# Patient Record
Sex: Female | Born: 1970 | Race: White | Hispanic: No | Marital: Married | Smoking: Never smoker
Health system: Southern US, Community
[De-identification: ages and names within clinical notes are randomized; demographics above are authoritative.]

## PROBLEM LIST (undated history)

## (undated) ENCOUNTER — Emergency Department (HOSPITAL_COMMUNITY): Payer: BC Managed Care – PPO | Source: Home / Self Care

## (undated) DIAGNOSIS — D126 Benign neoplasm of colon, unspecified: Secondary | ICD-10-CM

## (undated) DIAGNOSIS — I10 Essential (primary) hypertension: Secondary | ICD-10-CM

## (undated) DIAGNOSIS — K219 Gastro-esophageal reflux disease without esophagitis: Secondary | ICD-10-CM

## (undated) DIAGNOSIS — F329 Major depressive disorder, single episode, unspecified: Secondary | ICD-10-CM

## (undated) DIAGNOSIS — K5792 Diverticulitis of intestine, part unspecified, without perforation or abscess without bleeding: Secondary | ICD-10-CM

## (undated) DIAGNOSIS — K2970 Gastritis, unspecified, without bleeding: Secondary | ICD-10-CM

## (undated) DIAGNOSIS — K760 Fatty (change of) liver, not elsewhere classified: Secondary | ICD-10-CM

## (undated) DIAGNOSIS — M549 Dorsalgia, unspecified: Secondary | ICD-10-CM

## (undated) DIAGNOSIS — F32A Depression, unspecified: Secondary | ICD-10-CM

## (undated) DIAGNOSIS — G4730 Sleep apnea, unspecified: Secondary | ICD-10-CM

## (undated) DIAGNOSIS — K648 Other hemorrhoids: Secondary | ICD-10-CM

## (undated) DIAGNOSIS — E559 Vitamin D deficiency, unspecified: Secondary | ICD-10-CM

## (undated) DIAGNOSIS — R1310 Dysphagia, unspecified: Secondary | ICD-10-CM

## (undated) HISTORY — DX: Diverticulitis of intestine, part unspecified, without perforation or abscess without bleeding: K57.92

## (undated) HISTORY — PX: CHOLECYSTECTOMY: SHX55

## (undated) HISTORY — PX: OTHER SURGICAL HISTORY: SHX169

## (undated) HISTORY — DX: Benign neoplasm of colon, unspecified: D12.6

## (undated) HISTORY — PX: CERVICAL BIOPSY  W/ LOOP ELECTRODE EXCISION: SUR135

## (undated) HISTORY — DX: Essential (primary) hypertension: I10

## (undated) HISTORY — DX: Major depressive disorder, single episode, unspecified: F32.9

## (undated) HISTORY — DX: Other hemorrhoids: K64.8

## (undated) HISTORY — DX: Sleep apnea, unspecified: G47.30

## (undated) HISTORY — DX: Gastritis, unspecified, without bleeding: K29.70

## (undated) HISTORY — DX: Vitamin D deficiency, unspecified: E55.9

## (undated) HISTORY — PX: POLYPECTOMY: SHX149

## (undated) HISTORY — DX: Gastro-esophageal reflux disease without esophagitis: K21.9

## (undated) HISTORY — DX: Depression, unspecified: F32.A

## (undated) HISTORY — DX: Fatty (change of) liver, not elsewhere classified: K76.0

## (undated) HISTORY — PX: COLONOSCOPY: SHX174

## (undated) HISTORY — DX: Dorsalgia, unspecified: M54.9

## (undated) HISTORY — PX: MICROLARYNGOSCOPY WITH CO2 LASER AND EXCISION OF VOCAL CORD LESION: SHX5970

---
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
## 2007-06-22 ENCOUNTER — Ambulatory Visit: Payer: Self-pay | Admitting: Obstetrics and Gynecology

## 2010-01-18 DIAGNOSIS — G4730 Sleep apnea, unspecified: Secondary | ICD-10-CM

## 2010-01-18 HISTORY — DX: Sleep apnea, unspecified: G47.30

## 2012-04-07 ENCOUNTER — Encounter: Payer: Self-pay | Admitting: Gastroenterology

## 2012-04-10 ENCOUNTER — Encounter: Payer: Self-pay | Admitting: Internal Medicine

## 2012-04-10 ENCOUNTER — Telehealth: Payer: Self-pay | Admitting: *Deleted

## 2012-04-10 NOTE — Telephone Encounter (Signed)
Pt called for an earlier appt and I scheduled her for am to see Dr Rhea Belton. Pt states she had a CT done at [facility] last week showing diverticulosis, but she is having upper abdominal pain in the center of her abdomen between her breasts. She has tried generic Zantac with no help. Pt given an appt in am ; called for a copy of scan; pt does have the disc.   [phone]

## 2012-04-11 ENCOUNTER — Encounter: Payer: Self-pay | Admitting: Internal Medicine

## 2012-04-11 ENCOUNTER — Ambulatory Visit (INDEPENDENT_AMBULATORY_CARE_PROVIDER_SITE_OTHER): Payer: Self-pay | Admitting: Internal Medicine

## 2012-04-11 ENCOUNTER — Telehealth: Payer: Self-pay | Admitting: Internal Medicine

## 2012-04-11 VITALS — BP 134/80 | HR 93 | Ht 64.0 in | Wt 207.8 lb

## 2012-04-11 DIAGNOSIS — R1310 Dysphagia, unspecified: Secondary | ICD-10-CM

## 2012-04-11 DIAGNOSIS — I10 Essential (primary) hypertension: Secondary | ICD-10-CM | POA: Insufficient documentation

## 2012-04-11 DIAGNOSIS — R1013 Epigastric pain: Secondary | ICD-10-CM

## 2012-04-11 MED ORDER — PANTOPRAZOLE SODIUM 40 MG PO TBEC: 40.0000 mg | DELAYED_RELEASE_TABLET | Freq: Every day | ORAL | Status: DC

## 2012-04-11 NOTE — Progress Notes (Signed)
Subjective:    Patient ID: Carly [name], female    DOB: 1970-04-05, 42 y.o.   MRN: [id number]  HPI Mrs. Milligan is a 42 yo female with PMH of HTN who seen for evaluation of epigastric abdominal pain and dysphagia. The patient reports epigastric abdominal pain which she has been experiencing since late January 2014. This pain seems to come and go it does not radiate. At times it can be sharp in nature and also at times associated with nausea and vomiting. She recently had an episode over the weekend for the nausea was more severe and she had an episode of emesis which was clear without blood or bile. She denies heartburn. She has noted some solid food dysphagia without odynophagia. No melena or rectal bleeding. Bowel movements have been normal without diarrhea or constipation. She does occasionally use NSAIDs, but not on a daily basis. She denies early satiety. Overall appetite has been good. At times she feels like food worsens her symptoms, but then at other times she is unsure. No fevers or chills. No mouth ulcers. She's not having significant heartburn   Review of Systems As per history of present illness, otherwise negative  Past Medical History  Diagnosis Date  . Hypertension    History reviewed. No pertinent past surgical history.  has a current medication list which includes the following prescription(s): lisinopril-hydrochlorothiazide, nebivolol, norethin-eth estrad triphasic, simethicone, and pantoprazole.   Family History  Problem Relation Age of Onset  . Diabetes Father   . Diabetes Maternal Grandmother   . Diabetes Paternal Grandmother   . Lung cancer Father   . Bladder Cancer Father   . Heart disease Father    History   Social History  . Marital Status: Unknown    Spouse Name: N/A    Number of Children: 2  . Years of Education: N/A   Social History Main Topics  . Smoking status: Never Smoker   . Smokeless tobacco: Never Used  . Alcohol Use: No  . Drug Use: No   . Sexually Active: None   Other Topics Concern  . None   Social History Narrative  . None      Objective:   Physical Exam BP 134/80  Pulse 93  Ht 5\' 4"  (1.626 m)  Wt 207 lb 12.8 oz (94.257 kg)  BMI 35.65 kg/m2  SpO2 98% Constitutional: Well-developed and well-nourished. No distress. HEENT: Normocephalic and atraumatic. Oropharynx is clear and moist. No oropharyngeal exudate. Conjunctivae are normal.  No scleral icterus. Neck: Neck supple. Trachea midline. Cardiovascular: Normal rate, regular rhythm and intact distal pulses. No M/R/G Pulmonary/chest: Effort normal and breath sounds normal. No wheezing, rales or rhonchi. Abdominal: Soft, mild epigastric abdominal tenderness without rebound or guarding, nondistended. Bowel sounds active throughout. There are no masses palpable. No hepatosplenomegaly. Extremities: no clubbing, cyanosis, or edema Lymphadenopathy: No cervical adenopathy noted. Neurological: Alert and oriented to person place and time. Skin: Skin is warm and dry. No rashes noted. Psychiatric: Normal mood and affect. Behavior is normal.  CT scan from [facility] dated 04/06/2012, without contrast Impression. No evidence of bowel obstruction. Normal appendix. Colonic diverticulosis without associated inflammatory changes. No CT findings to account for the patient's abdominal pain.     Assessment & Plan:  42 yo female with PMH of HTN who seen for evaluation of epigastric abdominal pain and dysphagia.   1.  Epigastric abdominal pain/esophageal dysphagia -- the patient's symptoms her dyspeptic in nature and her CT scan which was reviewed was unremarkable.  I recommended further evaluation with upper endoscopy. I will start her on a trial of pantoprazole 40 mg daily. She's advised to take this 30 minutes to one hour before breakfast.   She voices understanding. Further recommendations to be made after her upper endoscopy. Upper endoscopy will help rule out  gastritis/duodenitis, PUD, and H. pylori. If her upper endoscopy is unremarkable she may need an ultrasound to further evaluate her gallbladder.

## 2012-04-11 NOTE — Patient Instructions (Addendum)
   You have been scheduled for an endoscopy with propofol. Please follow written instructions given to you at your visit today. If you use inhalers (even only as needed), please bring them with you on the day of your procedure.  We have sent the following medications to your pharmacy for you to pick up at your convenience: protonix, please take as directed                                               We are excited to introduce MyChart, a new best-in-class service that provides you online access to important information in your electronic medical record. We want to make it easier for you to view your health information - all in one secure location - when and where you need it. We expect MyChart will enhance the quality of care and service we provide.  When you register for MyChart, you can:    View your test results.    Request appointments and receive appointment reminders via email.    Request medication renewals.    View your medical history, allergies, medications and immunizations.    Communicate with your physician's office through a password-protected site.    Conveniently print information such as your medication lists.  To find out if MyChart is right for you, please talk to a member of our clinical staff today. We will gladly answer your questions about this free health and wellness tool.  If you are age 41 or older and want a member of your family to have access to your record, you must provide written consent by completing a proxy form available at our office. Please speak to our clinical staff about guidelines regarding accounts for patients younger than age 64.  As you activate your MyChart account and need any technical assistance, please call the MyChart technical support line at [phone]) 83-CHART 601-648-2407) or email your question to [email or web address]. If you email your question(s), please include your name, a return phone number and the best time to reach  you.  If you have non-urgent health-related questions, you can send a message to our office through MyChart at [email or web address]. If you have a medical emergency, call 911.  Thank you for using MyChart as your new health and wellness resource!   MyChart licensed from [employer],  4540-9811. Patents Pending.

## 2012-04-11 NOTE — Telephone Encounter (Signed)
Pt said she has a medication card for prescriptions and [facility] no longer takes it. She asked if I could send in her Rx for protonix to [facility]. I told her I will and if that is still too expensive she could try over the counter Prilosec or omeprazole. Pt stated understanding.

## 2012-04-26 ENCOUNTER — Encounter: Payer: Self-pay | Admitting: Internal Medicine

## 2012-04-26 ENCOUNTER — Ambulatory Visit (AMBULATORY_SURGERY_CENTER): Payer: Self-pay | Admitting: Internal Medicine

## 2012-04-26 VITALS — BP 150/96 | HR 76 | Temp 98.9°F | Resp 21 | Ht 64.0 in | Wt 207.0 lb

## 2012-04-26 DIAGNOSIS — R1013 Epigastric pain: Secondary | ICD-10-CM

## 2012-04-26 DIAGNOSIS — R1310 Dysphagia, unspecified: Secondary | ICD-10-CM

## 2012-04-26 DIAGNOSIS — K209 Esophagitis, unspecified: Secondary | ICD-10-CM

## 2012-04-26 MED ORDER — SODIUM CHLORIDE 0.9 % IV SOLN: 500.0000 mL | INTRAVENOUS | Status: DC

## 2012-04-26 NOTE — Progress Notes (Signed)
A/ox3 pleased with MAC report to Jane RN 

## 2012-04-26 NOTE — Patient Instructions (Addendum)

## 2012-04-26 NOTE — Progress Notes (Signed)
Patient did not experience any of the following events: a burn prior to discharge; a fall within the facility; wrong site/side/patient/procedure/implant event; or a hospital transfer or hospital admission upon discharge from the facility. (G8907) Patient did not have preoperative order for IV antibiotic SSI prophylaxis. (G8918)  

## 2012-04-26 NOTE — Op Note (Signed)
[facility] [address] [place], [address]   ENDOSCOPY PROCEDURE REPORT  PATIENT: Carly, [name]  MR#: [id number] BIRTHDATE: Aug 31, 1970 , 41  yrs. old GENDER: Female ENDOSCOPIST: Beverley Fiedler, MD REFERRED BY: PROCEDURE DATE:  04/26/2012 PROCEDURE:  EGD w/ biopsy ASA CLASS:     Class II INDICATIONS:  Epigastric pain.   Dysphagia. MEDICATIONS: MAC sedation, administered by CRNA and Propofol (Diprivan) 260 mg IV TOPICAL ANESTHETIC: none  DESCRIPTION OF PROCEDURE: After the risks benefits and alternatives of the procedure were thoroughly explained, informed consent was obtained.  The LB GIF-H180 [id number] endoscope was introduced through the mouth and advanced to the second portion of the duodenum. Without limitations.  The instrument was slowly withdrawn as the mucosa was fully examined.   ESOPHAGUS: The mucosa of the esophagus appeared normal.  Multiple biopsies were taken in the distal and mid esophagus to rule out eosinophilic esophagitis.   A normal Z-line was observed 40 cm from the incisors.  STOMACH: Mild gastritis (inflammation) was found in the gastric body and gastric antrum.  Multiple biopsies were performed using cold forceps.  Sample sent for histology and for helicobacter pylori testing.  DUODENUM: The duodenal mucosa showed no abnormalities in the bulb and second portion of the duodenum.  Retroflexed views revealed no abnormalities.     The scope was then withdrawn from the patient and the procedure completed.  COMPLICATIONS: There were no complications. ENDOSCOPIC IMPRESSION: 1.   The mucosa of the esophagus appeared normal; multiple biopsies were taken in the distal and mid esophagus to rule out eosinophilic esophagitis given dysphagia 2.   Normal Z-line was observed 40 cm from the incisors 3.   Gastritis (inflammation) was found in the gastric body and gastric antrum; multiple biopsies 4.   The duodenal mucosa showed no abnormalities  in the bulb and second portion of the duodenum  RECOMMENDATIONS: 1.  Await pathology results 2.  Follow-up of helicobacter pylori status, treat if indicated 3.  Continue taking your PPI (pantoprazole) once daily.  It is best to be taken 20-30 minutes prior to breakfast meal.  eSigned:  Beverley Fiedler, MD 04/26/2012 10:25 Ermalene Searing CC:The Patient, Raenette Rover, MD

## 2012-04-27 ENCOUNTER — Telehealth: Payer: Self-pay | Admitting: *Deleted

## 2012-04-27 ENCOUNTER — Ambulatory Visit: Payer: Self-pay | Admitting: Gastroenterology

## 2012-04-27 NOTE — Telephone Encounter (Signed)
  Follow up Call-  Call back number 04/26/2012  Post procedure Call Back phone  # [phone] cell  Permission to leave phone message Yes     Patient questions:  Do you have a fever, pain , or abdominal swelling? no Pain Score  0 *  Have you tolerated food without any problems? yes  Have you been able to return to your normal activities? yes  Do you have any questions about your discharge instructions: Diet   no Medications  no Follow up visit  no  Do you have questions or concerns about your Care? no  Actions: * If pain score is 4 or above: No action needed, pain <4.

## 2012-05-03 ENCOUNTER — Ambulatory Visit: Payer: Self-pay

## 2012-05-03 ENCOUNTER — Encounter: Payer: Self-pay | Admitting: Internal Medicine

## 2012-05-08 ENCOUNTER — Ambulatory Visit: Payer: Self-pay

## 2012-06-09 ENCOUNTER — Telehealth: Payer: Self-pay | Admitting: Internal Medicine

## 2012-06-09 DIAGNOSIS — R112 Nausea with vomiting, unspecified: Secondary | ICD-10-CM

## 2012-06-09 DIAGNOSIS — R1010 Upper abdominal pain, unspecified: Secondary | ICD-10-CM

## 2012-06-19 NOTE — Telephone Encounter (Signed)
Faxed order for U/S to [phone] to schedule. lmom for pt that the hospital or we will be calling her with an appt.

## 2012-06-19 NOTE — Telephone Encounter (Signed)
Informed pt that Dr Rhea Belton would like to do an U/S and if negative, we will order a HIDA scan to r/o Gallstones, etc. lmom at [facility] for someone in scheduling to call me back. [phone]

## 2012-06-19 NOTE — Telephone Encounter (Signed)
Records reviewed, which consider right upper quadrant/gallbladder ultrasound to rule out stones. If negative would likely proceed to HIDA scan to evaluate gallbladder function. Upper endoscopy did show gastritis but no H. pylori or ulcer disease. I see by your conversation with her that she continues on PPI She can be seen by advanced practitioner this week if necessary

## 2012-06-19 NOTE — Telephone Encounter (Signed)
Pt seen 04/11/12 for epigastric pain, n/v; she also had a negative CT at [facility]. She had an EGD showing inflammation on 04/26/12. She is on Protonix and simethicone, prn. Pt states she had another spell over the weekend with sharp pain in her upper stomach and vomiting. She went to the ER at [facility] again over the weekend and was given fluids. Please advise. Thanks.

## 2012-06-28 ENCOUNTER — Telehealth: Payer: Self-pay | Admitting: Internal Medicine

## 2012-06-28 NOTE — Telephone Encounter (Signed)
No report here, will call [facility] in am. [phone]

## 2012-06-29 NOTE — Telephone Encounter (Signed)
Informed pt that Dr Rhea Belton is not here, but one our mds looked at her U/S and she may need a cholecystectomy. Pt asked that I fax everything to Dr Judith Part in [place] at [phone]; done.

## 2012-06-29 NOTE — Telephone Encounter (Signed)
Received fax from [facility] with U/S results showing a gallstone in the neck of the Gallbladder; no acute cholecystitis. lmom for pt to call back.

## 2012-07-03 NOTE — Telephone Encounter (Signed)
I cannot view the outside [place] results within the medical record at this time. I see RN report of gallstone in gallbladder neck.  This may be contributing to symptoms which she described in clinic. HIDA WITH CCK should help further determine significance of this stone.  If patient prefers, surgical consult could also be placed at this time.

## 2012-07-03 NOTE — Telephone Encounter (Signed)
Pt reports she doesn't have an appt with Dr Judith Part, but not until July d/t Dr Judith Part being off. Called [phone] to ask if Dr Judith Part needs the CCK HIDA prior to appt.Marland Kitchen Spoke with Corrie Dandy who will ask Dr Judith Part if he needs the HIDA.

## 2012-07-03 NOTE — Telephone Encounter (Signed)
Carly [name] with Dr Judith Part called to report,he looked at the U/S and since pt is symptomatic, that's all he needs at this time. Pt stated understanding.

## 2012-07-24 DIAGNOSIS — E782 Mixed hyperlipidemia: Secondary | ICD-10-CM | POA: Insufficient documentation

## 2012-07-24 DIAGNOSIS — E7849 Other hyperlipidemia: Secondary | ICD-10-CM | POA: Insufficient documentation

## 2013-05-28 ENCOUNTER — Ambulatory Visit: Payer: Self-pay

## 2013-09-16 ENCOUNTER — Other Ambulatory Visit: Payer: Self-pay | Admitting: Internal Medicine

## 2014-07-30 DIAGNOSIS — Z9989 Dependence on other enabling machines and devices: Secondary | ICD-10-CM | POA: Insufficient documentation

## 2016-03-25 ENCOUNTER — Encounter: Payer: Self-pay | Admitting: *Deleted

## 2016-04-02 ENCOUNTER — Encounter (INDEPENDENT_AMBULATORY_CARE_PROVIDER_SITE_OTHER): Payer: Self-pay

## 2016-04-02 ENCOUNTER — Ambulatory Visit (INDEPENDENT_AMBULATORY_CARE_PROVIDER_SITE_OTHER): Payer: BC Managed Care – PPO | Admitting: Internal Medicine

## 2016-04-02 ENCOUNTER — Other Ambulatory Visit (INDEPENDENT_AMBULATORY_CARE_PROVIDER_SITE_OTHER): Payer: BC Managed Care – PPO

## 2016-04-02 ENCOUNTER — Telehealth: Payer: Self-pay | Admitting: Internal Medicine

## 2016-04-02 ENCOUNTER — Encounter: Payer: Self-pay | Admitting: Internal Medicine

## 2016-04-02 VITALS — BP 136/88 | HR 92 | Ht 64.0 in | Wt 243.0 lb

## 2016-04-02 DIAGNOSIS — K219 Gastro-esophageal reflux disease without esophagitis: Secondary | ICD-10-CM | POA: Diagnosis not present

## 2016-04-02 DIAGNOSIS — R140 Abdominal distension (gaseous): Secondary | ICD-10-CM | POA: Diagnosis not present

## 2016-04-02 DIAGNOSIS — K5732 Diverticulitis of large intestine without perforation or abscess without bleeding: Secondary | ICD-10-CM

## 2016-04-02 LAB — COMPREHENSIVE METABOLIC PANEL
ALBUMIN: 3.9 g/dL (ref 3.5–5.2)
ALT: 15 U/L (ref 0–35)
AST: 13 U/L (ref 0–37)
Alkaline Phosphatase: 62 U/L (ref 39–117)
BILIRUBIN TOTAL: 0.3 mg/dL (ref 0.2–1.2)
BUN: 12 mg/dL (ref 6–23)
CHLORIDE: 99 meq/L (ref 96–112)
CO2: 27 meq/L (ref 19–32)
Calcium: 9.3 mg/dL (ref 8.4–10.5)
Creatinine, Ser: 0.88 mg/dL (ref 0.40–1.20)
GFR: 74.04 mL/min (ref >60.00)
Glucose, Bld: 101 mg/dL — ABNORMAL HIGH (ref 70–99)
POTASSIUM: 3.8 meq/L (ref 3.5–5.1)
SODIUM: 133 meq/L — AB (ref 135–145)
TOTAL PROTEIN: 6.9 g/dL (ref 6.0–8.3)

## 2016-04-02 LAB — CBC WITH DIFFERENTIAL/PLATELET
Basophils Absolute: 0.1 10*3/uL (ref 0.0–0.1)
Basophils Relative: 0.6 % (ref 0.0–3.0)
EOS ABS: 0.1 10*3/uL (ref 0.0–0.7)
EOS PCT: 1.1 % (ref 0.0–5.0)
HCT: 40.5 % (ref 36.0–46.0)
HEMOGLOBIN: 13.6 g/dL (ref 12.0–15.0)
Lymphocytes Relative: 17 % (ref 12.0–46.0)
Lymphs Abs: 1.8 10*3/uL (ref 0.7–4.0)
MCHC: 33.6 g/dL (ref 30.0–36.0)
MCV: 86 fl (ref 78.0–100.0)
MONO ABS: 0.6 10*3/uL (ref 0.1–1.0)
Monocytes Relative: 5.7 % (ref 3.0–12.0)
Neutro Abs: 7.9 10*3/uL — ABNORMAL HIGH (ref 1.4–7.7)
Neutrophils Relative %: 75.6 % (ref 43.0–77.0)
Platelets: 288 10*3/uL (ref 150.0–400.0)
RBC: 4.72 Mil/uL (ref 3.87–5.11)
RDW: 13.9 % (ref 11.5–15.5)
WBC: 10.4 10*3/uL (ref 4.0–10.5)

## 2016-04-02 MED ORDER — ESOMEPRAZOLE MAGNESIUM 40 MG PO CPDR: 40.0000 mg | DELAYED_RELEASE_CAPSULE | Freq: Every day | ORAL | 1 refills | Status: DC

## 2016-04-02 MED ORDER — HYOSCYAMINE SULFATE 0.125 MG SL SUBL: SUBLINGUAL_TABLET | SUBLINGUAL | 2 refills | Status: DC

## 2016-04-02 MED ORDER — NA SULFATE-K SULFATE-MG SULF 17.5-3.13-1.6 GM/177ML PO SOLN: 1.0000 | Freq: Once | ORAL | 0 refills | Status: AC

## 2016-04-02 NOTE — Progress Notes (Signed)
Patient ID: Carly [name], female   DOB: 1970/12/08, 46 y.o.   MRN: [id number] HPI: Carly [name] is a 46 year old female the past medical history of GERD, gastritis, hypertension sleep apnea and recent recurrent diverticulitis who seen in consultation at the request of Marrianne Mood, NP to evaluate recurrent diverticulitis and abdominal discomfort. She is here alone today.  She reports that she was diagnosed with acute diverticulitis by CT scan in September 2017. This was treated with antibiotics to resolution. This recurred in late November 2017 and she was again treated with antibiotics. These episodes were associated with mid and lower abdominal pain which progressed. During this time she had loose stools and one or 2 episodes of blood in her stools. She had what she was concerned could then 2 additional recurrences in late December 2017 in mid-January 2018 but these episodes were not treated with antibiotics. During these episodes she had a knifelike midabdominal pain associated with nausea and vomiting. She reduced her diet specifically to soft foods and liquids and symptoms improved. Also during attacks she feels abdominal bloating as well as "rumbling discomfort" in her mid and lower abdomen.  Recently bowel habits have varied from normal to loose stools. She denies melena. She never feels constipated.  She has been on Nexium 40 mg twice a day before meals prescribed by a surgeon in [facility]. Apparently she had an endoscopy in 2016 which she recalls being normal. She had her gallbladder removed in 2016 as well for symptomatic cholelithiasis. He reports this was laparoscopic and uneventful. She is not having significant heartburn indigestion and denies dysphagia today.  Family history negative for IBD or first-degree relative with colon cancer.  Past Medical History:  Diagnosis Date  . Depression   . Diverticulitis   . Gastritis   . Hypertension   . Sleep apnea     Past Surgical  History:  Procedure Laterality Date  . CHOLECYSTECTOMY    . 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 Norethin-Eth Estrad Triphasic (PIRMELLA 7/7/7 PO) Take 1 tablet by mouth daily.    Marland Kitchen lisinopril-hydrochlorothiazide (PRINZIDE,ZESTORETIC) 10-12.5 MG per tablet Take 1 tablet by mouth daily.    . nebivolol (BYSTOLIC) 10 MG tablet Take 10 mg by mouth daily.    . pantoprazole (PROTONIX) 40 MG tablet TAKE 1 TABLET BY MOUTH EVERY DAY 30 tablet 0  . simethicone (MYLICON) 80 MG chewable tablet Chew 80 mg by mouth as needed for flatulence.     No facility-administered medications prior to visit.     Allergies  Allergen Reactions  . Cephalosporins Rash    ceflex per the pt    Family History  Problem Relation Age of Onset  . Diabetes Father   . Lung cancer Father   . Bladder Cancer Father   . Heart disease Father   . Diabetes Maternal Grandmother   . Diabetes Paternal Grandmother   . Colon cancer Paternal Aunt 46    decsd  . Esophageal cancer Neg Hx   . Rectal cancer Neg Hx   . Stomach cancer Neg Hx     Social History  Substance Use Topics  . Smoking status: Never Smoker  . Smokeless tobacco: Never Used  . Alcohol use No    ROS: As per history of present illness, otherwise negative  BP 136/88 (BP Location: Left Arm, Patient Position: Sitting, Cuff Size: Large)   Pulse 92  Ht 5\' 4"  (1.626 m) Comment: height measured without shoes  Wt 243 lb (110.2 kg)   LMP 03/19/2016   BMI 41.71 kg/m  Constitutional: Well-developed and well-nourished. No distress. HEENT: Normocephalic and atraumatic. Oropharynx is clear and moist. No oropharyngeal exudate. Conjunctivae are normal.  No scleral icterus. Neck: Neck supple. Trachea midline. Cardiovascular: Normal rate, regular rhythm and intact distal pulses. No M/R/G Pulmonary/chest: Effort normal and breath sounds normal. No wheezing, rales or rhonchi. Abdominal:  Soft, mild diffuse tenderness without rebound or guarding, obese, nondistended. Bowel sounds active throughout. Piercing at umbilicus Extremities: no clubbing, cyanosis, or edema Neurological: Alert and oriented to person place and time. Skin: Skin is warm and dry. No rashes noted. Psychiatric: Normal mood and affect. Behavior is normal.  RELEVANT LABS AND IMAGING: Mild acute sigmoid diverticulitis.  No evidence of abscess or other complication.   Electronically Signed By: Marcello Fennel M.D. On: 10/09/2015 15:05  Result Narrative  CLINICAL DATA:Left lower quadrant pain and nausea for 2 days.  EXAM: CT ABDOMEN AND PELVIS WITH CONTRAST  TECHNIQUE: Multidetector CT imaging of the abdomen and pelvis was performed using the standard protocol following bolus administration of intravenous contrast.  CONTRAST:100 mL Omnipaque 300  COMPARISON:04/06/2012  FINDINGS: Lower Chest: No acute findings.  Hepatobiliary: No mass identified. Prior cholecystectomy noted. No evidence of biliary dilatation.  Pancreas:No mass or inflammatory changes.  Spleen:Unremarkable.  Adrenals/Urinary Tract: No masses identified. No evidence of hydronephrosis.  Stomach/Bowel: Colonic diverticulosis is noted. Mild wall thickening and pericolonic inflammatory change is seen involving the proximal sigmoid colon, consistent with mild diverticulitis. No evidence of abscess or free fluid. Normal appendix visualized.  Vascular/Lymphatic: No pathologically enlarged lymph nodes. No abdominal aortic aneurysm.  Reproductive: Unremarkable appearance of uterus and ovaries. No mass identified.  Other:None.  Musculoskeletal: No suspicious bone lesions identified. Moderate to severe degenerative disc disease noted at L4-5 and L5-1.     ASSESSMENT/PLAN: 46 year old female the past medical history of GERD, gastritis, hypertension sleep apnea and recent recurrent diverticulitis who seen in  consultation at the request of Marrianne Mood, NP to evaluate recurrent diverticulitis and abdominal discomfort.  1. Recurrent diverticulitis/abdominal pain with bloating -- no evidence for acute diverticulitis today. Given multiple episodes of presumed diverticulitis in 1 definitive episode documented by CT scan in September 2017 I recommended proceeding to colonoscopy. We discussed the risks, benefits and alternatives and she wishes to proceed. I will prescribe Levsin 0.125 mg sublingual 1-2 tablets every 6-8 hours as needed for crampy abdominal pain. Check CBC and CMP today. Should she develop what she feels is recurrent diverticulitis prior to colonoscopy she is asked to notify me and I would recommend CT scan of the abdomen and pelvis with IV contrast.  2. GERD/indigestion -- history of. No alarm symptoms. She likely does not need twice a day PPI. I recommended she decrease Nexium to 40 mg once daily. This is best used before breakfast.      HX:[id number] Carly Dean, Np [address] [place], [place] [address]

## 2016-04-02 NOTE — Telephone Encounter (Signed)
Per Dr Vena Rua note, patient is to take Nexium 40 mg once daily before breakfast. Rx sent to pharmacy and pt advised.

## 2016-04-02 NOTE — Patient Instructions (Signed)
We have sent the following medications to your pharmacy for you to pick up at your convenience:  [facility] has requested that you go to the basement for the following lab work before leaving today:  CBC, CMP  You have been scheduled for a colonoscopy. Please follow written instructions given to you at your visit today.  Please pick up your prep supplies at the pharmacy within the next 1-3 days. If you use inhalers (even only as needed), please bring them with you on the day of your procedure. Your physician has requested that you go to www.startemmi.com and enter the access code given to you at your visit today. This web site gives a general overview about your procedure. However, you should still follow specific instructions given to you by our office regarding your preparation for the procedure.

## 2016-04-06 ENCOUNTER — Encounter: Payer: Self-pay | Admitting: Internal Medicine

## 2016-04-19 ENCOUNTER — Encounter: Payer: Self-pay | Admitting: Internal Medicine

## 2016-04-19 ENCOUNTER — Ambulatory Visit (AMBULATORY_SURGERY_CENTER): Payer: BC Managed Care – PPO | Admitting: Internal Medicine

## 2016-04-19 VITALS — BP 137/86 | HR 87 | Temp 98.6°F | Resp 16 | Ht 64.0 in | Wt 243.0 lb

## 2016-04-19 DIAGNOSIS — K5730 Diverticulosis of large intestine without perforation or abscess without bleeding: Secondary | ICD-10-CM

## 2016-04-19 DIAGNOSIS — D126 Benign neoplasm of colon, unspecified: Secondary | ICD-10-CM | POA: Diagnosis not present

## 2016-04-19 DIAGNOSIS — Z8719 Personal history of other diseases of the digestive system: Secondary | ICD-10-CM

## 2016-04-19 DIAGNOSIS — D123 Benign neoplasm of transverse colon: Secondary | ICD-10-CM

## 2016-04-19 DIAGNOSIS — K635 Polyp of colon: Secondary | ICD-10-CM

## 2016-04-19 DIAGNOSIS — D120 Benign neoplasm of cecum: Secondary | ICD-10-CM

## 2016-04-19 MED ORDER — SODIUM CHLORIDE 0.9 % IV SOLN: 500.0000 mL | INTRAVENOUS | Status: DC

## 2016-04-19 NOTE — Patient Instructions (Signed)
YOU HAD AN ENDOSCOPIC PROCEDURE TODAY AT THE [facility]:   Refer to the procedure report that was given to you for any specific questions about what was found during the examination.  If the procedure report does not answer your questions, please call your gastroenterologist to clarify.  If you requested that your care partner not be given the details of your procedure findings, then the procedure report has been included in a sealed envelope for you to review at your convenience later.  YOU SHOULD EXPECT: Some feelings of bloating in the abdomen. Passage of more gas than usual.  Walking can help get rid of the air that was put into your GI tract during the procedure and reduce the bloating. If you had a lower endoscopy (such as a colonoscopy or flexible sigmoidoscopy) you may notice spotting of blood in your stool or on the toilet paper. If you underwent a bowel prep for your procedure, you may not have a normal bowel movement for a few days.  Please Note:  You might notice some irritation and congestion in your nose or some drainage.  This is from the oxygen used during your procedure.  There is no need for concern and it should clear up in a day or so.  SYMPTOMS TO REPORT IMMEDIATELY:   Following lower endoscopy (colonoscopy or flexible sigmoidoscopy):  Excessive amounts of blood in the stool  Significant tenderness or worsening of abdominal pains  Swelling of the abdomen that is new, acute  Fever of 100F or higher  For urgent or emergent issues, a gastroenterologist can be reached at any hour by calling [phone].   DIET:  We do recommend a small meal at first, but then you may proceed to your regular diet.  Drink plenty of fluids but you should avoid alcoholic beverages for 24 hours.  MEDICATIONS:  Continue present medications.  ACTIVITY:  You should plan to take it easy for the rest of today and you should NOT DRIVE or use heavy machinery until tomorrow (because of the  sedation medicines used during the test).    FOLLOW UP: Our staff will call the number listed on your records the next business day following your procedure to check on you and address any questions or concerns that you may have regarding the information given to you following your procedure. If we do not reach you, we will leave a message.  However, if you are feeling well and you are not experiencing any problems, there is no need to return our call.  We will assume that you have returned to your regular daily activities without incident.  If any biopsies were taken you will be contacted by phone or by letter within the next 1-3 weeks.  Please call us at [phone] if you have not heard about the biopsies in 3 weeks.   Thank you for allowing us to provide for your healthcare needs today.   SIGNATURES/CONFIDENTIALITY: You and/or your care partner have signed paperwork which will be entered into your electronic medical record.  These signatures attest to the fact that that the information above on your After Visit Summary has been reviewed and is understood.  Full responsibility of the confidentiality of this discharge information lies with you and/or your care-partner. 

## 2016-04-19 NOTE — Op Note (Signed)
[facility] Patient Name: Carly [name] Procedure Date: 04/19/2016 2:35 PM MRN: [id number] Endoscopist: Jerene Bears , MD Age: 46 Referring MD:  Date of Birth: [birth year] Gender: Female Account #: [id number] Procedure:                Colonoscopy Indications:              This is the patient's first colonoscopy, Follow-up                            of diverticulitis Medicines:                Monitored Anesthesia Care Procedure:                Pre-Anesthesia Assessment:                           - Prior to the procedure, a History and Physical                            was performed, and patient medications and                            allergies were reviewed. The patient's tolerance of                            previous anesthesia was also reviewed. The risks                            and benefits of the procedure and the sedation                            options and risks were discussed with the patient.                            All questions were answered, and informed consent                            was obtained. Prior Anticoagulants: The patient has                            taken no previous anticoagulant or antiplatelet                            agents. ASA Grade Assessment: II - A patient with                            mild systemic disease. After reviewing the risks                            and benefits, the patient was deemed in                            satisfactory condition to undergo the procedure.  After obtaining informed consent, the colonoscope                            was passed under direct vision. Throughout the                            procedure, the patient's blood pressure, pulse, and                            oxygen saturations were monitored continuously. The                            Colonoscope was introduced through the anus and                            advanced to the the cecum, identified by                          appendiceal orifice and ileocecal valve. The                            colonoscopy was performed without difficulty. The                            patient tolerated the procedure well. The quality                            of the bowel preparation was good. The ileocecal                            valve, appendiceal orifice, and rectum were                            photographed. Scope In: 2:45:43 PM Scope Out: 3:01:08 PM Scope Withdrawal Time: 0 hours 10 minutes 38 seconds  Total Procedure Duration: 0 hours 15 minutes 25 seconds  Findings:                 The digital rectal exam was normal.                           Two sessile polyps were found in the hepatic                            flexure and cecum. The polyps were 3 to 4 mm in                            size. These polyps were removed with a cold snare.                            Resection and retrieval were complete.                           Scattered small-mouthed diverticula were found in  the sigmoid colon, descending colon, proximal                            transverse colon and hepatic flexure.                           Internal hemorrhoids were found during                            retroflexion. The hemorrhoids were small.                           The exam was otherwise without abnormality. Complications:            No immediate complications. Estimated Blood Loss:     Estimated blood loss was minimal. Impression:               - Two 3 to 4 mm polyps at the hepatic flexure and                            in the cecum, removed with a cold snare. Resected                            and retrieved.                           - Mild diverticulosis in the sigmoid colon, in the                            descending colon, in the proximal transverse colon                            and at the hepatic flexure.                           - Small internal hemorrhoids.                            - The examination was otherwise normal. Recommendation:           - Patient has a contact number available for                            emergencies. The signs and symptoms of potential                            delayed complications were discussed with the                            patient. Return to normal activities tomorrow.                            Written discharge instructions were provided to the                            patient.                           -  Resume previous diet.                           - Continue present medications.                           - Await pathology results.                           - Repeat colonoscopy is recommended. The                            colonoscopy date will be determined after pathology                            results from today's exam become available for                            review. Jerene Bears, MD 04/19/2016 3:04:50 PM This report has been signed electronically.

## 2016-04-19 NOTE — Progress Notes (Signed)
Pt's states no medical or surgical changes since previsit or office visit. 

## 2016-04-19 NOTE — Progress Notes (Signed)
Called to room to assist during endoscopic procedure.  Patient ID and intended procedure confirmed with present staff. Received instructions for my participation in the procedure from the performing physician.  

## 2016-04-19 NOTE — Progress Notes (Signed)
To recovery, report to Hines, RN, VSS 

## 2016-04-20 ENCOUNTER — Telehealth: Payer: Self-pay

## 2016-04-20 NOTE — Telephone Encounter (Signed)
  Follow up Call-  Call back number 04/19/2016  Post procedure Call Back phone  # [phone]  Permission to leave phone message Yes  Some recent data might be hidden     Patient questions:  Do you have a fever, pain , or abdominal swelling? No. Pain Score  0 *  Have you tolerated food without any problems? Yes.    Have you been able to return to your normal activities? Yes.    Do you have any questions about your discharge instructions: Diet   No. Medications  No. Follow up visit  No.  Do you have questions or concerns about your Care? No.  Actions: * If pain score is 4 or above: No action needed, pain <4.

## 2016-04-28 ENCOUNTER — Encounter: Payer: Self-pay | Admitting: Internal Medicine

## 2016-06-04 ENCOUNTER — Other Ambulatory Visit: Payer: Self-pay | Admitting: Internal Medicine

## 2016-11-04 ENCOUNTER — Other Ambulatory Visit: Payer: Self-pay | Admitting: *Deleted

## 2016-11-04 MED ORDER — ESOMEPRAZOLE MAGNESIUM 40 MG PO CPDR: 40.0000 mg | DELAYED_RELEASE_CAPSULE | Freq: Every day | ORAL | 1 refills | Status: DC

## 2017-01-18 HISTORY — PX: SHOULDER ARTHROSCOPY: SHX128

## 2017-06-04 ENCOUNTER — Other Ambulatory Visit: Payer: Self-pay | Admitting: Internal Medicine

## 2017-11-14 ENCOUNTER — Encounter: Payer: Self-pay | Admitting: *Deleted

## 2017-11-24 ENCOUNTER — Ambulatory Visit: Payer: BC Managed Care – PPO | Admitting: Internal Medicine

## 2017-11-24 ENCOUNTER — Other Ambulatory Visit (INDEPENDENT_AMBULATORY_CARE_PROVIDER_SITE_OTHER): Payer: BC Managed Care – PPO

## 2017-11-24 ENCOUNTER — Encounter

## 2017-11-24 ENCOUNTER — Encounter: Payer: Self-pay | Admitting: Internal Medicine

## 2017-11-24 VITALS — BP 122/78 | HR 84 | Ht 64.0 in | Wt 241.0 lb

## 2017-11-24 DIAGNOSIS — Z8719 Personal history of other diseases of the digestive system: Secondary | ICD-10-CM

## 2017-11-24 DIAGNOSIS — R1013 Epigastric pain: Secondary | ICD-10-CM | POA: Diagnosis not present

## 2017-11-24 LAB — COMPREHENSIVE METABOLIC PANEL
ALBUMIN: 3.9 g/dL (ref 3.5–5.2)
ALT: 19 U/L (ref 0–35)
AST: 17 U/L (ref 0–37)
Alkaline Phosphatase: 62 U/L (ref 39–117)
BUN: 15 mg/dL (ref 6–23)
CALCIUM: 9.3 mg/dL (ref 8.4–10.5)
CHLORIDE: 104 meq/L (ref 96–112)
CO2: 26 mEq/L (ref 19–32)
Creatinine, Ser: 0.81 mg/dL (ref 0.40–1.20)
GFR: 80.87 mL/min (ref >60.00)
Glucose, Bld: 102 mg/dL — ABNORMAL HIGH (ref 70–99)
POTASSIUM: 4 meq/L (ref 3.5–5.1)
Sodium: 139 mEq/L (ref 135–145)
Total Bilirubin: 0.2 mg/dL (ref 0.2–1.2)
Total Protein: 6.9 g/dL (ref 6.0–8.3)

## 2017-11-24 LAB — CBC WITH DIFFERENTIAL/PLATELET
BASOS PCT: 1.4 % (ref 0.0–3.0)
Basophils Absolute: 0.1 10*3/uL (ref 0.0–0.1)
EOS ABS: 0.1 10*3/uL (ref 0.0–0.7)
EOS PCT: 1.9 % (ref 0.0–5.0)
HEMATOCRIT: 40.8 % (ref 36.0–46.0)
HEMOGLOBIN: 13.8 g/dL (ref 12.0–15.0)
Lymphocytes Relative: 21.8 % (ref 12.0–46.0)
Lymphs Abs: 1.6 10*3/uL (ref 0.7–4.0)
MCHC: 33.8 g/dL (ref 30.0–36.0)
MCV: 86.9 fl (ref 78.0–100.0)
MONO ABS: 0.5 10*3/uL (ref 0.1–1.0)
Monocytes Relative: 7.1 % (ref 3.0–12.0)
Neutro Abs: 4.9 10*3/uL (ref 1.4–7.7)
Neutrophils Relative %: 67.8 % (ref 43.0–77.0)
Platelets: 301 10*3/uL (ref 150.0–400.0)
RBC: 4.7 Mil/uL (ref 3.87–5.11)
RDW: 14.5 % (ref 11.5–15.5)
WBC: 7.3 10*3/uL (ref 4.0–10.5)

## 2017-11-24 LAB — LIPASE: Lipase: 32 U/L (ref 11.0–59.0)

## 2017-11-24 NOTE — Progress Notes (Signed)
   Subjective:    Patient ID: Carly [name], female    DOB: [birth year], 47 y.o.   MRN: [id number]  HPI Carly [name] is a 47 year old female with a history of GERD, gastritis, diverticulitis of the colon, hypertension and sleep apnea who is seen in follow-up to evaluate upper abdominal pain.  She is here alone today.  She was last seen at the time of colonoscopy on 04/19/2016.  Colonoscopy was performed on 04/19/2016 and showed 2 small polyps removed from the cecum and hepatic flexure.  Scattered diverticulosis in the sigmoid, descending colon, transverse colon and hepatic flexure.  Small internal hemorrhoids.  1 of the polyps removed was a tubular adenoma without high-grade dysplasia, the other benign polypoid mucosa.  She reports that she has been doing well.  She is been maintained on Nexium 40 mg for heartburn and this controls symptoms well.  She is developed several episodes of epigastric sharp abdominal pain radiating across the upper abdomen.  It is crescendo decrescendo and builds over 1 to 1-1/2 days.  Will slowly resolve and then she will be sore for several days.  Associated with nausea and vomiting, though more nausea than vomiting.  Occurred in August, then again in September.  Low level discomfort today.  She does not use NSAIDs.  Not bowel movement associated.  Cannot find a dietary trigger.  Bowel movements at times feels slightly constipated.  She uses stool softeners and Gas-X on occasion.  Had a good bowel movement this morning.  No blood in her stool or melena.  No mid or lower abdominal pain.  Abdominal pain as described above which is epigastric in nature is completely gone between episodes.  She did have gallstones and cholecystectomy in 2014.   Review of Systems As per HPI, otherwise negative  Current Medications, Allergies, Past Medical History, Past Surgical History, Family History and Social History were reviewed in [employer] record.       Objective:   Physical Exam BP 122/78   Pulse 84   Ht 5\' 4"  (1.626 m)   Wt 241 lb (109.3 kg)   BMI 41.37 kg/m  Constitutional: Well-developed and well-nourished. No distress. HEENT: Normocephalic and atraumatic.  Conjunctivae are normal.  No scleral icterus. Neck: Neck supple. Trachea midline. Cardiovascular: Normal rate, regular rhythm and intact distal pulses. No M/R/G Pulmonary/chest: Effort normal and breath sounds normal. No wheezing, rales or rhonchi. Abdominal: Soft, mild epigastric tenderness without rebound or guarding, nondistended. Bowel sounds active throughout. There are no masses palpable. No hepatosplenomegaly. Extremities: no clubbing, cyanosis, or edema Neurological: Alert and oriented to person place and time. Skin: Skin is warm and dry. Psychiatric: Normal mood and affect. Behavior is normal.     Assessment & Plan:  47 year old female with a history of GERD, gastritis, diverticulitis of the colon, hypertension and sleep apnea who is seen in follow-up to evaluate upper abdominal pain.  1.  Episodic epigastric pain --historically her pain sounds biliary in nature though she is status post cholecystectomy.  I question whether she could have a retained CBD stone causing intermittent obstruction and symptoms.  We will check CBC, CMP and lipase today.  I recommended MRI abdomen with and without contrast plus MRCP to evaluate hepato-biliary and pancreatic anatomy.  Gastritis or ulcer disease is considered but felt less likely given daily PPI.  She also has periods where she does not have upper abdominal pain which would be atypical of gastritis or ulcer disease.

## 2017-11-24 NOTE — Patient Instructions (Signed)
Your provider has requested that you go to the basement level for lab work before leaving today. Press "B" on the elevator. The lab is located at the first door on the left as you exit the elevator.  You have been scheduled for an MRI/MRCP at [facility] on 12/07/17. Your appointment time is 12:00 pm. Please arrive 15 minutes prior to your appointment time for registration purposes. Please make certain not to have anything to eat or drink 4 hours prior to your test. In addition, if you have any metal in your body, have a pacemaker or defibrillator, please be sure to let your ordering physician know. This test typically takes 45 minutes to 1 hour to complete. Should you need to reschedule, please call [phone] to do so.  If you are age 34 or older, your body mass index should be between 23-30. Your Body mass index is 41.37 kg/m. If this is out of the aforementioned range listed, please consider follow up with your Primary Care Provider.  If you are age 24 or younger, your body mass index should be between 19-25. Your Body mass index is 41.37 kg/m. If this is out of the aformentioned range listed, please consider follow up with your Primary Care Provider.

## 2017-11-29 DIAGNOSIS — Z9889 Other specified postprocedural states: Secondary | ICD-10-CM | POA: Insufficient documentation

## 2017-12-04 ENCOUNTER — Ambulatory Visit (HOSPITAL_COMMUNITY)
Admission: RE | Admit: 2017-12-04 | Discharge: 2017-12-04 | Disposition: A | Discharge status: Home / Self Care | Payer: BC Managed Care – PPO | Source: Ambulatory Visit | Attending: Internal Medicine | Admitting: Internal Medicine

## 2017-12-04 ENCOUNTER — Other Ambulatory Visit: Payer: Self-pay | Admitting: Internal Medicine

## 2017-12-04 DIAGNOSIS — K760 Fatty (change of) liver, not elsewhere classified: Secondary | ICD-10-CM | POA: Diagnosis not present

## 2017-12-04 DIAGNOSIS — R1013 Epigastric pain: Secondary | ICD-10-CM

## 2017-12-04 DIAGNOSIS — Z9049 Acquired absence of other specified parts of digestive tract: Secondary | ICD-10-CM | POA: Diagnosis not present

## 2017-12-04 MED ORDER — GADOBUTROL 1 MMOL/ML IV SOLN
10.0000 mL | Freq: Once | INTRAVENOUS | Status: AC | PRN
  Administered 2017-12-04: 10 mL via INTRAVENOUS

## 2017-12-05 ENCOUNTER — Encounter: Payer: Self-pay | Admitting: Internal Medicine

## 2017-12-05 ENCOUNTER — Other Ambulatory Visit: Payer: Self-pay

## 2017-12-05 ENCOUNTER — Telehealth: Payer: Self-pay | Admitting: Internal Medicine

## 2017-12-05 DIAGNOSIS — R109 Unspecified abdominal pain: Secondary | ICD-10-CM

## 2017-12-05 NOTE — Telephone Encounter (Signed)
Noted  

## 2017-12-05 NOTE — Telephone Encounter (Signed)
Pt states Saturday she started having bad abdominal pain along with nausea and vomiting. Reports she was vomiting yellow liquid and also had diarrhea Saturday night. Reports now she is having a sharp pain here and there. Pt states she had MRI done. Dr. Hilarie Fredrickson aware.

## 2017-12-05 NOTE — Telephone Encounter (Signed)
See result note from MRI/MRCP

## 2017-12-06 ENCOUNTER — Other Ambulatory Visit: Payer: Self-pay

## 2017-12-06 ENCOUNTER — Telehealth: Payer: Self-pay | Admitting: Internal Medicine

## 2017-12-06 ENCOUNTER — Other Ambulatory Visit (INDEPENDENT_AMBULATORY_CARE_PROVIDER_SITE_OTHER): Payer: BC Managed Care – PPO

## 2017-12-06 DIAGNOSIS — R109 Unspecified abdominal pain: Secondary | ICD-10-CM | POA: Diagnosis not present

## 2017-12-06 LAB — COMPREHENSIVE METABOLIC PANEL
ALBUMIN: 3.9 g/dL (ref 3.5–5.2)
ALK PHOS: 54 U/L (ref 39–117)
ALT: 18 U/L (ref 0–35)
AST: 14 U/L (ref 0–37)
BUN: 15 mg/dL (ref 6–23)
CALCIUM: 8.7 mg/dL (ref 8.4–10.5)
CO2: 26 mEq/L (ref 19–32)
Chloride: 102 mEq/L (ref 96–112)
Creatinine, Ser: 0.85 mg/dL (ref 0.40–1.20)
GFR: 76.48 mL/min (ref >60.00)
GLUCOSE: 99 mg/dL (ref 70–99)
POTASSIUM: 4 meq/L (ref 3.5–5.1)
Sodium: 138 mEq/L (ref 135–145)
Total Bilirubin: 0.2 mg/dL (ref 0.2–1.2)
Total Protein: 6.7 g/dL (ref 6.0–8.3)

## 2017-12-06 LAB — CBC WITH DIFFERENTIAL/PLATELET
BASOS ABS: 0 10*3/uL (ref 0.0–0.1)
Basophils Relative: 0.3 % (ref 0.0–3.0)
EOS PCT: 2.9 % (ref 0.0–5.0)
Eosinophils Absolute: 0.2 10*3/uL (ref 0.0–0.7)
HCT: 39.8 % (ref 36.0–46.0)
HEMOGLOBIN: 13.2 g/dL (ref 12.0–15.0)
LYMPHS PCT: 22.3 % (ref 12.0–46.0)
Lymphs Abs: 1.9 10*3/uL (ref 0.7–4.0)
MCHC: 33.2 g/dL (ref 30.0–36.0)
MCV: 87 fl (ref 78.0–100.0)
MONOS PCT: 7.6 % (ref 3.0–12.0)
Monocytes Absolute: 0.6 10*3/uL (ref 0.1–1.0)
Neutro Abs: 5.7 10*3/uL (ref 1.4–7.7)
Neutrophils Relative %: 66.9 % (ref 43.0–77.0)
Platelets: 265 10*3/uL (ref 150.0–400.0)
RBC: 4.58 Mil/uL (ref 3.87–5.11)
RDW: 14.2 % (ref 11.5–15.5)
WBC: 8.5 10*3/uL (ref 4.0–10.5)

## 2017-12-06 LAB — LIPASE: LIPASE: 31 U/L (ref 11.0–59.0)

## 2017-12-06 NOTE — Telephone Encounter (Signed)
Pts EGD rescheduled to 12/20/17@4pm , pt aware of appt.

## 2017-12-06 NOTE — Telephone Encounter (Signed)
Left message for pt that Dr. Hilarie Fredrickson

## 2017-12-07 ENCOUNTER — Ambulatory Visit (HOSPITAL_COMMUNITY): Payer: BC Managed Care – PPO

## 2017-12-14 ENCOUNTER — Telehealth: Payer: Self-pay | Admitting: Internal Medicine

## 2017-12-14 NOTE — Telephone Encounter (Signed)
Spoke with pt and questions answered.

## 2017-12-14 NOTE — Telephone Encounter (Signed)
Pt call back wanting to speak with nurse she had additional questions that she forgot to ask when speaking to the nurse

## 2017-12-20 ENCOUNTER — Ambulatory Visit (AMBULATORY_SURGERY_CENTER): Payer: BC Managed Care – PPO | Admitting: Internal Medicine

## 2017-12-20 ENCOUNTER — Encounter: Payer: Self-pay | Admitting: Internal Medicine

## 2017-12-20 VITALS — BP 121/73 | HR 79 | Temp 98.6°F | Resp 15 | Ht 64.0 in | Wt 241.0 lb

## 2017-12-20 DIAGNOSIS — K2970 Gastritis, unspecified, without bleeding: Secondary | ICD-10-CM | POA: Diagnosis not present

## 2017-12-20 DIAGNOSIS — K2950 Unspecified chronic gastritis without bleeding: Secondary | ICD-10-CM

## 2017-12-20 DIAGNOSIS — R1013 Epigastric pain: Secondary | ICD-10-CM

## 2017-12-20 MED ORDER — SODIUM CHLORIDE 0.9 % IV SOLN: 500.0000 mL | Freq: Once | INTRAVENOUS | Status: AC

## 2017-12-20 NOTE — Patient Instructions (Addendum)
YOU HAD AN ENDOSCOPIC PROCEDURE TODAY AT THE [facility]:   Refer to the procedure report that was given to you for any specific questions about what was found during the examination.  If the procedure report does not answer your questions, please call your gastroenterologist to clarify.  If you requested that your care partner not be given the details of your procedure findings, then the procedure report has been included in a sealed envelope for you to review at your convenience later.  YOU SHOULD EXPECT: Some feelings of bloating in the abdomen. Passage of more gas than usual.  Walking can help get rid of the air that was put into your GI tract during the procedure and reduce the bloating. If you had a lower endoscopy (such as a colonoscopy or flexible sigmoidoscopy) you may notice spotting of blood in your stool or on the toilet paper. If you underwent a bowel prep for your procedure, you may not have a normal bowel movement for a few days.  Please Note:  You might notice some irritation and congestion in your nose or some drainage.  This is from the oxygen used during your procedure.  There is no need for concern and it should clear up in a day or so.  SYMPTOMS TO REPORT IMMEDIATELY:   Following upper endoscopy (EGD)  Vomiting of blood or coffee ground material  New chest pain or pain under the shoulder blades  Painful or persistently difficult swallowing  New shortness of breath  Fever of 100F or higher  Black, tarry-looking stools  For urgent or emergent issues, a gastroenterologist can be reached at any hour by calling [phone].   DIET:  We do recommend a small meal at first, but then you may proceed to your regular diet.  Drink plenty of fluids but you should avoid alcoholic beverages for 24 hours.  ACTIVITY:  You should plan to take it easy for the rest of today and you should NOT DRIVE or use heavy machinery until tomorrow (because of the sedation medicines used  during the test).    FOLLOW UP: Our staff will call the number listed on your records the next business day following your procedure to check on you and address any questions or concerns that you may have regarding the information given to you following your procedure. If we do not reach you, we will leave a message.  However, if you are feeling well and you are not experiencing any problems, there is no need to return our call.  We will assume that you have returned to your regular daily activities without incident.  If any biopsies were taken you will be contacted by phone or by letter within the next 1-3 weeks.  Please call us at [phone] if you have not heard about the biopsies in 3 weeks.    SIGNATURES/CONFIDENTIALITY: You and/or your care partner have signed paperwork which will be entered into your electronic medical record.  These signatures attest to the fact that that the information above on your After Visit Summary has been reviewed and is understood.  Full responsibility of the confidentiality of this discharge information lies with you and/or your care-partner. 

## 2017-12-20 NOTE — Progress Notes (Signed)
Called to room to assist during endoscopic procedure.  Patient ID and intended procedure confirmed with present staff. Received instructions for my participation in the procedure from the performing physician.  

## 2017-12-20 NOTE — Progress Notes (Signed)
Pt's states no medical or surgical changes since previsit or office visit. 

## 2017-12-20 NOTE — Progress Notes (Signed)
Report to PACU, RN, vss, BBS= Clear.  

## 2017-12-21 ENCOUNTER — Telehealth: Payer: Self-pay | Admitting: *Deleted

## 2017-12-21 NOTE — Telephone Encounter (Signed)
  Follow up Call-  Call back number 12/20/2017 04/19/2016  Post procedure Call Back phone  # [phone]  Permission to leave phone message Yes Yes  Some recent data might be hidden     Patient questions:  Do you have a fever, pain , or abdominal swelling? No. Pain Score  0 *  Have you tolerated food without any problems? Yes.    Have you been able to return to your normal activities? Yes.    Do you have any questions about your discharge instructions: Diet   No. Medications  No. Follow up visit  No.  Do you have questions or concerns about your Care? No.  Actions: * If pain score is 4 or above: No action needed, pain <4.

## 2017-12-22 NOTE — Op Note (Signed)
[place] Patient Name: Carly [name] Procedure Date: 12/20/2017 3:11 PM MRN: [id number] Endoscopist: Jerene Bears , MD Age: 47 Referring MD:  Date of Birth: 02/24/1970 Gender: Female Account #: [id number] Procedure:                Upper GI endoscopy Indications:              Epigastric abdominal pain Medicines:                Monitored Anesthesia Care Procedure:                Pre-Anesthesia Assessment:                           - Prior to the procedure, a History and Physical                            was performed, and patient medications and                            allergies were reviewed. The patient's tolerance of                            previous anesthesia was also reviewed. The risks                            and benefits of the procedure and the sedation                            options and risks were discussed with the patient.                            All questions were answered, and informed consent                            was obtained. Prior Anticoagulants: The patient has                            taken no previous anticoagulant or antiplatelet                            agents. ASA Grade Assessment: II - A patient with                            mild systemic disease. After reviewing the risks                            and benefits, the patient was deemed in                            satisfactory condition to undergo the procedure.                           After obtaining informed consent, the endoscope was  passed under direct vision. Throughout the                            procedure, the patient's blood pressure, pulse, and                            oxygen saturations were monitored continuously. The                            Endoscope was introduced through the mouth, and                            advanced to the second part of duodenum. The upper                            GI endoscopy was accomplished  without difficulty.                            The patient tolerated the procedure well. Scope In: Scope Out: Findings:                 The examined esophagus was normal.                           The entire examined stomach was normal. Biopsies                            were taken with a cold forceps for histology and                            Helicobacter pylori testing.                           The examined duodenum was normal. Complications:            No immediate complications. Estimated Blood Loss:     Estimated blood loss: none. Estimated blood loss                            was minimal. Impression:               - Normal esophagus.                           - Normal stomach. Biopsied.                           - Normal examined duodenum. Recommendation:           - Patient has a contact number available for                            emergencies. The signs and symptoms of potential                            delayed complications were discussed with the  patient. Return to normal activities tomorrow.                            Written discharge instructions were provided to the                            patient.                           - Resume previous diet.                           - Continue present medications.                           - Await pathology results. Jerene Bears, MD 12/20/2017 3:24:54 PM This report has been signed electronically.

## 2017-12-26 ENCOUNTER — Encounter: Payer: Self-pay | Admitting: Internal Medicine

## 2018-01-02 ENCOUNTER — Other Ambulatory Visit: Payer: Self-pay

## 2018-01-02 ENCOUNTER — Telehealth: Payer: Self-pay | Admitting: Internal Medicine

## 2018-01-02 DIAGNOSIS — R1084 Generalized abdominal pain: Secondary | ICD-10-CM

## 2018-01-02 NOTE — Telephone Encounter (Signed)
Spoke with pt and she is aware, orders in epic. 

## 2018-01-02 NOTE — Telephone Encounter (Signed)
Pt states Dr. Hilarie Fredrickson told her to call when she had her EGD with any problems if they happened again. Pt reports yesterday she started having bloating, vomiting and diarrhea, and abd pain. Has not vomited since 6pm yesterday but still having a little diarrhea. ABD pain comes and goes and reports it is not as bad as it was last time. Dr. Hilarie Fredrickson notified.

## 2018-01-02 NOTE — Telephone Encounter (Signed)
Please see if she can come for CMP, lipase Thanks JMP

## 2018-01-03 ENCOUNTER — Other Ambulatory Visit: Payer: Self-pay

## 2018-01-03 ENCOUNTER — Other Ambulatory Visit (INDEPENDENT_AMBULATORY_CARE_PROVIDER_SITE_OTHER): Payer: BC Managed Care – PPO

## 2018-01-03 DIAGNOSIS — E876 Hypokalemia: Secondary | ICD-10-CM

## 2018-01-03 DIAGNOSIS — R1084 Generalized abdominal pain: Secondary | ICD-10-CM | POA: Diagnosis not present

## 2018-01-03 LAB — COMPREHENSIVE METABOLIC PANEL
ALT: 24 U/L (ref 0–35)
AST: 24 U/L (ref 0–37)
Albumin: 3.7 g/dL (ref 3.5–5.2)
Alkaline Phosphatase: 54 U/L (ref 39–117)
BILIRUBIN TOTAL: 0.3 mg/dL (ref 0.2–1.2)
BUN: 15 mg/dL (ref 6–23)
CO2: 27 mEq/L (ref 19–32)
Calcium: 8.1 mg/dL — ABNORMAL LOW (ref 8.4–10.5)
Chloride: 101 mEq/L (ref 96–112)
Creatinine, Ser: 0.81 mg/dL (ref 0.40–1.20)
GFR: 80.83 mL/min (ref >60.00)
GLUCOSE: 119 mg/dL — AB (ref 70–99)
Potassium: 2.9 mEq/L — ABNORMAL LOW (ref 3.5–5.1)
Sodium: 138 mEq/L (ref 135–145)
Total Protein: 6.6 g/dL (ref 6.0–8.3)

## 2018-01-03 LAB — LIPASE: Lipase: 28 U/L (ref 11.0–59.0)

## 2018-01-03 MED ORDER — POTASSIUM CHLORIDE 20 MEQ PO PACK: 40.0000 meq | PACK | Freq: Two times a day (BID) | ORAL | 0 refills | Status: DC

## 2018-01-04 ENCOUNTER — Other Ambulatory Visit: Payer: Self-pay

## 2018-01-04 MED ORDER — SUCRALFATE 1 GM/10ML PO SUSP: 1.0000 g | Freq: Four times a day (QID) | ORAL | 1 refills | Status: DC

## 2018-01-09 ENCOUNTER — Other Ambulatory Visit (INDEPENDENT_AMBULATORY_CARE_PROVIDER_SITE_OTHER): Payer: BC Managed Care – PPO

## 2018-01-09 DIAGNOSIS — E876 Hypokalemia: Secondary | ICD-10-CM

## 2018-01-09 LAB — BASIC METABOLIC PANEL
BUN: 14 mg/dL (ref 6–23)
CHLORIDE: 103 meq/L (ref 96–112)
CO2: 27 mEq/L (ref 19–32)
Calcium: 9.5 mg/dL (ref 8.4–10.5)
Creatinine, Ser: 0.82 mg/dL (ref 0.40–1.20)
GFR: 79.69 mL/min (ref >60.00)
Glucose, Bld: 113 mg/dL — ABNORMAL HIGH (ref 70–99)
POTASSIUM: 4.4 meq/L (ref 3.5–5.1)
Sodium: 137 mEq/L (ref 135–145)

## 2018-01-13 ENCOUNTER — Encounter: Payer: BC Managed Care – PPO | Admitting: Internal Medicine

## 2018-01-19 ENCOUNTER — Encounter: Payer: Self-pay | Admitting: *Deleted

## 2018-01-24 ENCOUNTER — Telehealth: Payer: Self-pay | Admitting: Internal Medicine

## 2018-01-24 MED ORDER — SUCRALFATE 1 GM/10ML PO SUSP: 1.0000 g | Freq: Three times a day (TID) | ORAL | 0 refills | Status: DC

## 2018-01-24 NOTE — Telephone Encounter (Signed)
Rx sent 

## 2018-01-25 ENCOUNTER — Ambulatory Visit: Payer: BC Managed Care – PPO | Admitting: Internal Medicine

## 2018-01-31 ENCOUNTER — Telehealth: Payer: Self-pay | Admitting: Internal Medicine

## 2018-01-31 NOTE — Telephone Encounter (Signed)
Pt states that she started having diarrhea around midnight and had 2 diarrhea stools, then she vomited 3-4 times. Now pt reports she is having the upper abdominal pain that she usually has, just reports the symptoms presented themselves in reverse order. Pt wants to know what Dr. Hilarie [name] recommends. Please advise.

## 2018-02-02 NOTE — Telephone Encounter (Signed)
I recommend she come back to be seen by me or advanced practitioner in light of ongoing symptoms and recently unrevealing work-up

## 2018-02-02 NOTE — Telephone Encounter (Signed)
Pt scheduled to see Dr. Hilarie Fredrickson 02/07/18@10am . Pt aware of appt.

## 2018-02-07 ENCOUNTER — Encounter

## 2018-02-07 ENCOUNTER — Encounter: Payer: Self-pay | Admitting: Internal Medicine

## 2018-02-07 ENCOUNTER — Ambulatory Visit: Payer: BC Managed Care – PPO | Admitting: Internal Medicine

## 2018-02-07 VITALS — BP 144/84 | HR 84 | Ht 64.0 in | Wt 246.1 lb

## 2018-02-07 DIAGNOSIS — R1013 Epigastric pain: Secondary | ICD-10-CM

## 2018-02-07 DIAGNOSIS — Z9049 Acquired absence of other specified parts of digestive tract: Secondary | ICD-10-CM

## 2018-02-07 MED ORDER — AMITRIPTYLINE HCL 25 MG PO TABS: ORAL_TABLET | ORAL | 1 refills | Status: DC

## 2018-02-07 NOTE — Progress Notes (Addendum)
Subjective:    Patient ID: Carly [name], female    DOB: April 23, 1970, 48 y.o.   MRN: [id number]  HPI Carly [name] is a 48 year old female with a history of GERD, gastritis, history of diverticulitis of the colon, adenomatous colon polyp, recurrent episodic upper abdominal pain, hypertension and sleep apnea who is here for follow-up.  She is here with her husband today.  She was last seen in the office on 11/24/2017 and for procedure on 12/20/2017.  She is having episodes of epigastric abdominal pain.  She has had 2 such episodes around 12/06/2017 and 01/05/2018.  She describes epigastric abdominal pain that radiates across her upper abdomen.  This starts as a mild pain but builds up and then slowly resolves.  Once it becomes more intense it is often associated with nausea and vomiting.  Reminiscent of prior gallbladder type pain.  Can be associated with loose stools after an attack.  About 2 weeks ago she had another attack which started differently and that she developed diarrhea and then several hours later upper abdominal pain and eventually nausea with vomiting.  This episode did not last as long.  Was not associated with any sick contacts or fever to her knowledge.  In between episodes she is completely asymptomatic with no abdominal pain, nausea, vomiting, diarrhea or constipation.  There is been no blood in her stool or melena.  She is maintained on Nexium 40 mg daily.  We tried adding Carafate which did not help.  I performed an MRI of the abdomen with MRCP which did not show any structural abnormalities within the liver, bile duct or pancreas/pancreatic duct.  The pain again occurs seemingly about once a month around the same time of the month.  She is unaware if this is related to her cycle particularly during the placebo doses of her oral contraceptive pill.  She does not have a history of endometriosis to her knowledge.  Upper endoscopy was then performed to exclude upper GI pathology.   This was completely normal.  Biopsies showed chronic inactive gastritis without H. Pylori.  She had a colonoscopy on 04/19/2016 which showed 2 small polyps 1 of which was adenomatous.  Diverticulosis in the hepatic flexure, proximal transverse, sigmoid and descending colon.  Internal hemorrhoids.   Review of Systems As per HPI, otherwise negative  Current Medications, Allergies, Past Medical History, Past Surgical History, Family History and Social History were reviewed in [employer] record.     Objective:   Physical Exam BP (!) 144/84 (BP Location: Left Arm, Patient Position: Sitting, Cuff Size: Normal)   Pulse 84   Ht 5\' 4"  (1.626 m)   Wt 246 lb 2 oz (111.6 kg)   LMP 01/17/2018   BMI 42.25 kg/m  Constitutional: Well-developed and well-nourished. No distress. HEENT: Normocephalic and atraumatic.  Conjunctivae are normal.  No scleral icterus. Psychiatric: Normal mood and affect. Behavior is normal.  Results for Carly [name] (MRN [id number]) as of 02/07/2018 12:04  Ref. Range 04/02/2016 09:39 11/24/2017 09:45 12/06/2017 08:30 01/03/2018 08:10  Alkaline Phosphatase Latest Ref Range: 39 - 117 U/L 62 62 54 54  Albumin Latest Ref Range: 3.5 - 5.2 g/dL 3.9 3.9 3.9 3.7  Lipase Latest Ref Range: 11.0 - 59.0 U/L  32.0 31.0 28.0  AST Latest Ref Range: 0 - 37 U/L 13 17 14 24   ALT Latest Ref Range: 0 - 35 U/L 15 19 18 24   Total Protein Latest Ref Range: 6.0 - 8.3 g/dL 6.9  6.9 6.7 6.6  Total Bilirubin Latest Ref Range: 0.2 - 1.2 mg/dL 0.3 0.2 0.2 0.3   MRI ABDOMEN WITHOUT AND WITH CONTRAST (INCLUDING MRCP)   TECHNIQUE: Multiplanar multisequence MR imaging of the abdomen was performed both before and after the administration of intravenous contrast. Heavily T2-weighted images of the biliary and pancreatic ducts were obtained, and three-dimensional MRCP images were rendered by post processing.   CONTRAST:  10 mL Gadovist IV   COMPARISON:  CT abdomen/pelvis dated  10/09/2015   FINDINGS: Lower chest: Lung bases are clear.   Hepatobiliary: Mild hepatic steatosis. Liver is otherwise within normal limits. No focal hepatic lesions.   Status post cholecystectomy. No intrahepatic or extrahepatic ductal dilatation. Common duct measures 5 mm.   Pancreas:  Within normal limits.   Spleen:  Within normal limits.   Adrenals/Urinary Tract:  Adrenal glands are within normal limits.   Kidneys are within normal limits.  No hydronephrosis.   Stomach/Bowel: Stomach is within normal limits.   Visualized bowel is unremarkable.   Vascular/Lymphatic: No evidence of abdominal aortic aneurysm. Retroaortic left renal vein.   No suspicious abdominal lymphadenopathy.   Other:  No abdominal ascites.   Musculoskeletal: No focal osseous lesions.   IMPRESSION: Status post cholecystectomy. No intrahepatic or extrahepatic ductal dilatation. Common duct measures 5 mm.   Mild hepatic steatosis.   Otherwise negative MRI abdomen.     Electronically Signed   By: Julian Hy M.D.   On: 12/04/2017 11:01        Assessment & Plan:  48 year old female with a history of GERD, gastritis, history of diverticulitis of the colon, adenomatous colon polyp, recurrent episodic upper abdominal pain, hypertension and sleep apnea who is here for follow-up.   1.  Episodic epigastric abdominal pain associated with nausea and vomiting --symptoms at least by description sound biliary in nature.  To me this raises a question of sphincter of Oddi dysfunction though I have not seen any bile duct dilation or elevation in her liver labs or lipase following an attack of this pain.  We discussed this at length today including the the fact that if this is sphincter of Oddi dysfunction it would be type III.  Other considerations include intermittent partial obstruction, food or environmental allergy, endometriosis given the cyclical nature, duodenal hyperalgesia or somatosensory  hypersensitivity following cholecystectomy.  Given the lack of objective evidence going to try her on amitriptyline 25 mg nightly x7 days increasing to 50 mg thereafter.  We will continue Nexium 40 mg once daily.  Discontinue Carafate.  If amitriptyline is not helpful, I will asked Dr. Rush Landmark to review.  Query whether or not EUS to exclude microlithiasis or sphincter of Oddi manometry would be helpful here.  I have given her prescription, given that she lives over 30 minutes away, for CMP and lipase to be done 24 hours after the next attack of her pain starts.  Should we catch her liver enzymes or lipase bumped up then this would strongly support SOD.  35 minutes spent with the patient today. Greater than 50% was spent in counseling and coordination of care with the patient   Addendum: I discussed the case with Dr. Stefani Dama Roddy he had the following recommendation:  "Ulice Dash,  Thanks for reaching out.  We now no longer classify patients as SOD other than being functional GI pain.  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  I do think, as I saw in her 2014 records  that since she had stones that ruling out biliary sludge/microlithiasis would be worthwhile however.  We do not have the Manometry equipment.   [place], it is only actually at [facility] and [facility].  If our EUS is negative, we could send her down to [facility] to discuss, but they will be hesitant to want to pursue an ERCP, I suspect especially since no LFT abnormalities and no ductal dilation.  If we find sludge, then happy to consider ERCP.  Hope that helps.  Gabe"  Dottie, Please let patient know that I discussed the case with Dr. Rush Landmark.  The internal ultrasound, EUS, can be performed to evaluate the bile duct and rule out microlithiasis or very small stones which might intermittently be causing symptoms. For now my recommendation would be to try 6 to 8 weeks of amitriptyline  therapy.  If no benefit, and we cannot determine link between hormone/oral contraceptive pill and her attacks, then I would recommend we proceed to endoscopic ultrasound. Thank you  JMP

## 2018-02-07 NOTE — Progress Notes (Signed)
I have spoken to patient to advise of conversation between Dr Rush Landmark and Dr Hilarie Fredrickson as well as their recommendations. She verbalizes understanding of this and will be in contact with [place] after keeping a journal of her abdominal pain vs menstrual cycles as well as course of amitriptyline.

## 2018-02-07 NOTE — Patient Instructions (Addendum)
If you are age 47 or older, your body mass index should be between 23-30. Your Body mass index is 42.25 kg/m. If this is out of the aforementioned range listed, please consider follow up with your Primary Care Provider.  If you are age 31 or younger, your body mass index should be between 19-25. Your Body mass index is 42.25 kg/m. If this is out of the aformentioned range listed, please consider follow up with your Primary Care Provider.    Continue Nexium 40mg .  Keep a journal of  epigastric pain  Attacks.   We have provided you a prescription for labs to have 24hrs after epigastric pain - attack.   We have sent the following medications to your pharmacy for you to pick up at your convenience: Amitriptyline

## 2018-03-01 ENCOUNTER — Other Ambulatory Visit: Payer: Self-pay | Admitting: Internal Medicine

## 2018-03-07 ENCOUNTER — Other Ambulatory Visit: Payer: Self-pay | Admitting: Internal Medicine

## 2018-03-09 ENCOUNTER — Ambulatory Visit: Payer: BC Managed Care – PPO | Admitting: Internal Medicine

## 2018-06-09 ENCOUNTER — Other Ambulatory Visit: Payer: Self-pay | Admitting: Internal Medicine

## 2018-06-13 ENCOUNTER — Other Ambulatory Visit: Payer: Self-pay

## 2018-06-13 DIAGNOSIS — E663 Overweight: Secondary | ICD-10-CM

## 2018-06-13 NOTE — Telephone Encounter (Signed)
Patient should monitor her please make referral to healthy weight and wellness, patient is interested in bariatric surgery

## 2018-09-02 ENCOUNTER — Other Ambulatory Visit: Payer: Self-pay | Admitting: Internal Medicine

## 2018-11-14 ENCOUNTER — Other Ambulatory Visit: Payer: Self-pay

## 2018-11-14 ENCOUNTER — Ambulatory Visit (INDEPENDENT_AMBULATORY_CARE_PROVIDER_SITE_OTHER): Payer: BC Managed Care – PPO | Admitting: Family Medicine

## 2018-11-14 ENCOUNTER — Encounter (INDEPENDENT_AMBULATORY_CARE_PROVIDER_SITE_OTHER): Payer: Self-pay | Admitting: Family Medicine

## 2018-11-14 VITALS — BP 119/78 | HR 100 | Temp 98.0°F | Ht 64.0 in | Wt 265.0 lb

## 2018-11-14 DIAGNOSIS — R5383 Other fatigue: Secondary | ICD-10-CM | POA: Diagnosis not present

## 2018-11-14 DIAGNOSIS — R0602 Shortness of breath: Secondary | ICD-10-CM

## 2018-11-14 DIAGNOSIS — E559 Vitamin D deficiency, unspecified: Secondary | ICD-10-CM

## 2018-11-14 DIAGNOSIS — R739 Hyperglycemia, unspecified: Secondary | ICD-10-CM

## 2018-11-14 DIAGNOSIS — Z1331 Encounter for screening for depression: Secondary | ICD-10-CM | POA: Diagnosis not present

## 2018-11-14 DIAGNOSIS — Z0289 Encounter for other administrative examinations: Secondary | ICD-10-CM

## 2018-11-14 DIAGNOSIS — Z9189 Other specified personal risk factors, not elsewhere classified: Secondary | ICD-10-CM | POA: Diagnosis not present

## 2018-11-14 DIAGNOSIS — I10 Essential (primary) hypertension: Secondary | ICD-10-CM | POA: Diagnosis not present

## 2018-11-14 DIAGNOSIS — Z6841 Body Mass Index (BMI) 40.0 and over, adult: Secondary | ICD-10-CM

## 2018-11-14 NOTE — Progress Notes (Signed)
Office: [phone]  /  Fax: [phone]   Dear Dr. Zenovia Jarred,   Thank you for referring Carly [name] to our clinic. The following note includes my evaluation and treatment recommendations.  HPI:   Chief Complaint: OBESITY    Carly [name] has been referred by Lajuan Lines. Pyrtle, MD for consultation regarding her obesity and obesity related comorbidities.    Carly [name] (MR# [id number]) is a 48 y.o. female who presents on 11/14/2018 for obesity evaluation and treatment. Current BMI is Body mass index is 45.49 kg/m. Carly [name] has been struggling with her weight for many years and has been unsuccessful in either losing weight, maintaining weight loss, or reaching her healthy weight goal.     Carly [name] attended our information session and states she is currently in the action stage of change and ready to dedicate time achieving and maintaining a healthier weight. Carly [name] is interested in becoming our patient and working on intensive lifestyle modifications including (but not limited to) diet, exercise and weight loss.    Carly [name] states her family eats meals together she thinks her family will eat healthier with her her desired weight loss is 105 lbs  she started gaining weight when she started her job  her heaviest weight ever was 267 lbs she is a picky eater and doesn't like to eat healthier foods  she has significant food cravings issues  she is frequently drinking liquids with calories she has problems with excessive hunger  she frequently eats larger portions than normal  she has binge eating behaviors she struggles with emotional eating    Fatigue Carly [name] feels her energy is lower than it should be. This has worsened with weight gain and has not worsened recently. Carly [name] admits to daytime somnolence and admits to waking up still tired. Patient is at risk for obstructive sleep apnea. Patient has a history of obstructive sleep apnea. Patient generally gets 8 or 9 hours of sleep per night,  and states they generally have generally restful sleep. Snoring is present. Apneic episodes are present. Epworth Sleepiness Score is 8.  Dyspnea on exertion Carly [name] notes increasing shortness of breath with exercising and seems to be worsening over time with weight gain. She notes getting out of breath sooner with activity than she used to. This has not gotten worse recently.   Hypertension Carly [name] is a 48 y.o. female with hypertension. Carly [name]'s blood pressure is currently well controlled and she is stable on medications. Carly [name] is using her CPAP nightly. She is working on weight loss to help control her blood pressure with the goal of decreasing her risk of heart attack and stroke. Carly [name] denies chest pain.  At risk for cardiovascular disease Carly [name] is at a higher than average risk for cardiovascular disease due to hypertension and obesity. She currently denies any chest pain.  Vitamin D Deficiency Carly [name] has a diagnosis of vitamin D deficiency. She is currently stable on OTC vit D and she does not have recent labs. Carly [name] denies nausea, vomiting, or muscle weakness.  Hyperglycemia Carly [name] has a history of multiple elevated blood glucose readings in Epic without a diagnosis of diabetes. She admits to polyphagia.  Depression Screen Carly [name]'s Food and Mood (modified PHQ-9) score was 15. Depression screen PHQ 2/9 11/14/2018  Decreased Interest 3  Down, Depressed, Hopeless 3  PHQ - 2 Score 6  Altered sleeping 0  Tired, decreased energy 3  Change in appetite 1  Feeling bad or failure about yourself  2  Trouble  concentrating 3  Moving slowly or fidgety/restless 0  Suicidal thoughts 0  PHQ-9 Score 15  Difficult doing work/chores Not difficult at all   ASSESSMENT AND PLAN:  Other fatigue - Plan: EKG 12-Lead, Vitamin B12, CBC with Differential/Platelet, Folate, T3, T4, free, TSH  Shortness of breath on exertion - Plan: Lipid Panel With LDL/HDL Ratio  Essential hypertension  Vitamin D  deficiency - Plan: VITAMIN D 25 Hydroxy (Vit-D Deficiency, Fractures)  Hyperglycemia - Plan: Comprehensive metabolic panel, Hemoglobin A1c, Insulin, random  Depression screening  At risk for heart disease  Class 3 severe obesity with serious comorbidity and body mass index (BMI) of 45.0 to 49.9 in adult, unspecified obesity type (HCC)  PLAN:  Fatigue Carly [name] was informed that her fatigue may be related to obesity, depression or many other causes. Labs will be ordered, and in the meanwhile, Carly [name] has agreed to work on diet, exercise, and weight loss to help with fatigue. Proper sleep hygiene was discussed including the need for 7-8 hours of quality sleep each night. A sleep study was not ordered based on symptoms and Epworth score. An EKG and an indirect calorimetry was ordered today. Carly [name] agrees to follow up in 2 weeks.  Dyspnea on exertion Carly [name]'s shortness of breath appears to be obesity related and exercise induced. She has agreed to work on weight loss and gradually increase exercise to treat her exercise induced shortness of breath. If Carly [name] follows our instructions and loses weight without improvement of her shortness of breath, we will plan to refer to pulmonology. We will monitor this condition regularly. Carly [name] agrees to this plan.  Hypertension We discussed sodium restriction, working on healthy weight loss, and a regular exercise program as the means to achieve improved blood pressure control. We will continue to monitor her blood pressure as well as her progress with the above lifestyle modifications. She will continue her medications as prescribed and to start her diet plan. She will watch for signs of hypotension as she continues her lifestyle modifications. Labs were ordered today and we will recheck in labs in 3 months. Carly [name] agreed with this plan and agreed to follow up as directed.  Cardiovascular risk counseling Carly [name] was given extended (15 minutes) coronary artery disease  prevention counseling today. She is 48 y.o. female and has risk factors for heart disease including hypertension and obesity. We discussed intensive lifestyle modifications today with an emphasis on specific weight loss instructions and strategies. Pt was also informed of the importance of increasing exercise and decreasing saturated fats to help prevent heart disease.  Vitamin D Deficiency Olee was informed that low vitamin D levels contribute to fatigue and are associated with obesity, breast, and colon cancer. Eltha agrees to continue to take prescription Vit D @50 ,000 IU every week #4 with no refills and will follow up for routine testing of vitamin D, at least 2-3 times per year. Labs were ordered today. She was informed of the risk of over-replacement of vitamin D and agrees to not increase her dose unless she discusses this with [place] first. Endiya agrees to follow up in 2 weeks as directed.  Hyperglycemia Fasting labs will be obtained and results with be discussed with Cherika in 2 weeks at her follow up visit. In the meanwhile, Adalyna was started on a lower simple carbohydrate diet and will work on weight loss efforts. Labs were ordered and Harlow agrees to start her diet prescription. Menaal agrees to follow up in 2 weeks.  Depression Screen Camila had a  strongly positive depression screening. Depression is commonly associated with obesity and often results in emotional eating behaviors. We will monitor this closely and work on CBT to help improve the non-hunger eating patterns. Referral to Psychology may be required if no improvement is seen as she continues in our clinic.  Obesity Dariah is currently in the action stage of change and her goal is to continue with weight loss efforts. I recommend Rosalynda begin the structured treatment plan as follows:  She has agreed to follow the Category 4 plan.  Lassandra has been instructed to eventually work up to a goal of 150 minutes of combined cardio and  strengthening exercise per week for weight loss and overall health benefits. We discussed the following Behavioral Modification Strategies today: increasing lean protein intake and decreasing simple carbohydrates.    She was informed of the importance of frequent follow up visits to maximize her success with intensive lifestyle modifications for her multiple health conditions. She was informed we would discuss her lab results at her next visit unless there is a critical issue that needs to be addressed sooner. Adam agreed to keep her next visit at the agreed upon time to discuss these results.  ALLERGIES: Allergies  Allergen Reactions   Cephalosporins Rash    ceflex per the pt    MEDICATIONS: Current Outpatient Medications on File Prior to Visit  Medication Sig Dispense Refill   amitriptyline (ELAVIL) 25 MG tablet TAKE 2 TABLETS BY MOUTH EVERY EVENING 180 tablet 0   Cholecalciferol (VITAMIN D) 125 MCG (5000 UT) CAPS Take 1 capsule by mouth daily.     escitalopram (LEXAPRO) 10 MG tablet Take 10 mg by mouth daily.     esomeprazole (NEXIUM) 40 MG capsule TAKE 1 CAPSULE (40 MG TOTAL) BY MOUTH DAILY. 30 MINUTES BEFORE BREAKFAST 90 capsule 1   lisinopril-hydrochlorothiazide (PRINZIDE,ZESTORETIC) 20-25 MG tablet Take 1 tablet by mouth daily.     norethindrone-ethinyl estradiol (CYCLAFEM) 0.5/0.75/1-35 MG-MCG tablet Take 1 tablet by mouth daily.     hyoscyamine (LEVSIN SL) 0.125 MG SL tablet 1-2 tablets under the tongue ever 6 - 8 hours as needed 30 tablet 2   meclizine (ANTIVERT) 25 MG tablet Take 1 tablet by mouth as needed.     Current Facility-Administered Medications on File Prior to Visit  Medication Dose Route Frequency Provider Last Rate Last Dose   0.9 %  sodium chloride infusion  500 mL Intravenous Continuous Pyrtle, Lajuan Lines, MD       0.9 %  sodium chloride infusion  500 mL Intravenous Once Pyrtle, Lajuan Lines, MD        PAST MEDICAL HISTORY: Past Medical History:  Diagnosis  Date   Back pain    Depression    Diverticulitis    Gastritis    GERD (gastroesophageal reflux disease)    Hepatic steatosis    Hypertension    Internal hemorrhoids    Sleep apnea    Swallowing difficulty    Tubular adenoma of colon    Vitamin D deficiency     PAST SURGICAL HISTORY: Past Surgical History:  Procedure Laterality Date   CHOLECYSTECTOMY     SHOULDER ARTHROSCOPY     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      SOCIAL HISTORY: Social History   Tobacco Use   Smoking status: Never Smoker   Smokeless tobacco: Never Used  Substance Use Topics   Alcohol use: No   Drug use: No    FAMILY HISTORY: Family History  Problem  Relation Age of Onset   Diabetes Father    Lung cancer Father    Bladder Cancer Father    Heart disease Father    Hypertension Father    Hyperlipidemia Father    Stroke Father    Obesity Father    Depression Mother    Diabetes Maternal Grandmother    Diabetes Paternal Grandmother    Colon cancer Paternal Aunt 81       decsd   Esophageal cancer Neg Hx    Rectal cancer Neg Hx    Stomach cancer Neg Hx     ROS: Review of Systems  Constitutional: Positive for malaise/fatigue.  Eyes:       Wears glasses or contacts.  Cardiovascular: Negative for chest pain.  Gastrointestinal: Negative for nausea and vomiting.  Musculoskeletal: Positive for back pain.       Negative for muscle weakness.  Skin: Positive for itching.  Endo/Heme/Allergies:       Positive for polyphagia.    PHYSICAL EXAM: Blood pressure 119/78, pulse 100, temperature 98 F (36.7 C), temperature source Oral, height 5\' 4"  (1.626 m), weight 265 lb (120.2 kg), last menstrual period 10/25/2018, SpO2 97 %. Body mass index is 45.49 kg/m. Physical Exam Vitals signs reviewed.  Constitutional:      Appearance: Normal appearance. She is obese.  HENT:     Head: Normocephalic and atraumatic.     Nose: Nose normal.  Eyes:     General: No  scleral icterus.    Extraocular Movements: Extraocular movements intact.  Neck:     Musculoskeletal: Normal range of motion and neck supple.     Thyroid: No thyromegaly.     Comments: Negative for thyromegaly. Cardiovascular:     Rate and Rhythm: Normal rate and regular rhythm.  Pulmonary:     Effort: Pulmonary effort is normal. No respiratory distress.  Abdominal:     Palpations: Abdomen is soft.     Tenderness: There is no abdominal tenderness.     Comments: Positive for obesity.  Musculoskeletal:     Comments: ROM normal in all extremities.  Skin:    General: Skin is warm and dry.  Neurological:     Mental Status: She is alert and oriented to person, place, and time.     Coordination: Coordination normal.  Psychiatric:        Mood and Affect: Mood normal.        Behavior: Behavior normal.     RECENT LABS AND TESTS: BMET    Component Value Date/Time   NA 137 01/09/2018 1156   K 4.4 01/09/2018 1156   CL 103 01/09/2018 1156   CO2 27 01/09/2018 1156   GLUCOSE 113 (H) 01/09/2018 1156   BUN 14 01/09/2018 1156   CREATININE 0.82 01/09/2018 1156   CALCIUM 9.5 01/09/2018 1156   No results found for: HGBA1C No results found for: INSULIN CBC    Component Value Date/Time   WBC 8.5 12/06/2017 0830   RBC 4.58 12/06/2017 0830   HGB 13.2 12/06/2017 0830   HCT 39.8 12/06/2017 0830   PLT 265.0 12/06/2017 0830   MCV 87.0 12/06/2017 0830   MCHC 33.2 12/06/2017 0830   RDW 14.2 12/06/2017 0830   LYMPHSABS 1.9 12/06/2017 0830   MONOABS 0.6 12/06/2017 0830   EOSABS 0.2 12/06/2017 0830   BASOSABS 0.0 12/06/2017 0830   Iron/TIBC/Ferritin/ %Sat No results found for: IRON, TIBC, FERRITIN, IRONPCTSAT Lipid Panel  No results found for: CHOL, TRIG, HDL, CHOLHDL, VLDL, LDLCALC, LDLDIRECT  Hepatic Function Panel     Component Value Date/Time   PROT 6.6 01/03/2018 0810   ALBUMIN 3.7 01/03/2018 0810   AST 24 01/03/2018 0810   ALT 24 01/03/2018 0810   ALKPHOS 54 01/03/2018 0810    BILITOT 0.3 01/03/2018 0810   No results found for: TSH  ECG  shows NSR with a rate of 98 BPM. INDIRECT CALORIMETER done today shows a VO2 of 433 and a REE of 3012.  Her calculated basal metabolic rate is 99991111 thus her basal metabolic rate is better than expected.  OBESITY BEHAVIORAL INTERVENTION VISIT  Today's visit was # 1   Starting weight: 265 lbs Starting date: 11/14/18   Today's weight : Weight: 265 lb (120.2 kg)  Today's date: 11/14/2018 Total lbs lost to date: 0    11/14/2018  Height 5\' 4"  (1.626 m)  Weight 265 lb (120.2 kg)  BMI (Calculated) 45.46  BLOOD PRESSURE - SYSTOLIC 123456  BLOOD PRESSURE - DIASTOLIC 78  Waist Measurement  52 inches   Body Fat % 49.4 %  Total Body Water (lbs) 88 lbs  RMR 3012    ASK: We discussed the diagnosis of obesity with Carly [name] today and Leaundra agreed to give [place] permission to discuss obesity behavioral modification therapy today.  ASSESS: Murray has the diagnosis of obesity and her BMI today is 45.46. Honey is in the action stage of change.   ADVISE: Early was educated on the multiple health risks of obesity as well as the benefit of weight loss to improve her health. She was advised of the need for long term treatment and the importance of lifestyle modifications to improve her current health and to decrease her risk of future health problems.  AGREE: Multiple dietary modification options and treatment options were discussed and Moneca agreed to follow the recommendations documented in the above note.  ARRANGE: Faeryn was educated on the importance of frequent visits to treat obesity as outlined per CMS and USPSTF guidelines and agreed to schedule her next follow up appointment today.  IMarcille Blanco, CMA, am acting as transcriptionist for Starlyn Skeans, MD   I have reviewed the above documentation for accuracy and completeness, and I agree with the above. -Dennard Nip, MD

## 2018-11-14 NOTE — Telephone Encounter (Signed)
Please advise 

## 2018-11-15 LAB — LIPID PANEL WITH LDL/HDL RATIO
Cholesterol, Total: 199 mg/dL (ref 100–199)
HDL: 35 mg/dL — ABNORMAL LOW (ref >39)
LDL Chol Calc (NIH): 88 mg/dL (ref 0–99)
LDL/HDL Ratio: 2.5 ratio (ref 0.0–3.2)
Triglycerides: 461 mg/dL — ABNORMAL HIGH (ref 0–149)
VLDL Cholesterol Cal: 76 mg/dL — ABNORMAL HIGH (ref 5–40)

## 2018-11-15 LAB — CBC WITH DIFFERENTIAL/PLATELET
Basophils Absolute: 0.1 10*3/uL (ref 0.0–0.2)
Basos: 1 %
EOS (ABSOLUTE): 0.2 10*3/uL (ref 0.0–0.4)
Eos: 2 %
Hematocrit: 40.2 % (ref 34.0–46.6)
Hemoglobin: 13.2 g/dL (ref 11.1–15.9)
Immature Grans (Abs): 0.1 10*3/uL (ref 0.0–0.1)
Immature Granulocytes: 1 %
Lymphocytes Absolute: 1.7 10*3/uL (ref 0.7–3.1)
Lymphs: 15 %
MCH: 28.6 pg (ref 26.6–33.0)
MCHC: 32.8 g/dL (ref 31.5–35.7)
MCV: 87 fL (ref 79–97)
Monocytes Absolute: 0.6 10*3/uL (ref 0.1–0.9)
Monocytes: 5 %
Neutrophils Absolute: 8.6 10*3/uL — ABNORMAL HIGH (ref 1.4–7.0)
Neutrophils: 76 %
Platelets: 306 10*3/uL (ref 150–450)
RBC: 4.62 x10E6/uL (ref 3.77–5.28)
RDW: 14.1 % (ref 11.7–15.4)
WBC: 11.2 10*3/uL — ABNORMAL HIGH (ref 3.4–10.8)

## 2018-11-15 LAB — TSH: TSH: 1.08 u[IU]/mL (ref 0.450–4.500)

## 2018-11-15 LAB — COMPREHENSIVE METABOLIC PANEL
ALT: 16 IU/L (ref 0–32)
AST: 21 IU/L (ref 0–40)
Albumin/Globulin Ratio: 1.5 (ref 1.2–2.2)
Albumin: 4 g/dL (ref 3.8–4.8)
Alkaline Phosphatase: 79 IU/L (ref 39–117)
BUN/Creatinine Ratio: 25 — ABNORMAL HIGH (ref 9–23)
BUN: 19 mg/dL (ref 6–24)
Bilirubin Total: <0.2 mg/dL (ref 0.0–1.2)
CO2: 21 mmol/L (ref 20–29)
Calcium: 9.2 mg/dL (ref 8.7–10.2)
Chloride: 100 mmol/L (ref 96–106)
Creatinine, Ser: 0.75 mg/dL (ref 0.57–1.00)
GFR calc Af Amer: 110 mL/min/{1.73_m2} (ref >59)
GFR calc non Af Amer: 95 mL/min/{1.73_m2} (ref >59)
Globulin, Total: 2.7 g/dL (ref 1.5–4.5)
Glucose: 101 mg/dL — ABNORMAL HIGH (ref 65–99)
Potassium: 4.7 mmol/L (ref 3.5–5.2)
Sodium: 137 mmol/L (ref 134–144)
Total Protein: 6.7 g/dL (ref 6.0–8.5)

## 2018-11-15 LAB — HEMOGLOBIN A1C
Est. average glucose Bld gHb Est-mCnc: 126 mg/dL
Hgb A1c MFr Bld: 6 % — ABNORMAL HIGH (ref 4.8–5.6)

## 2018-11-15 LAB — T4, FREE: Free T4: 0.97 ng/dL (ref 0.82–1.77)

## 2018-11-15 LAB — INSULIN, RANDOM: INSULIN: 43.3 u[IU]/mL — ABNORMAL HIGH (ref 2.6–24.9)

## 2018-11-15 LAB — VITAMIN B12: Vitamin B-12: 159 pg/mL — ABNORMAL LOW (ref 232–1245)

## 2018-11-15 LAB — T3: T3, Total: 211 ng/dL — ABNORMAL HIGH (ref 71–180)

## 2018-11-15 LAB — VITAMIN D 25 HYDROXY (VIT D DEFICIENCY, FRACTURES): Vit D, 25-Hydroxy: 43.3 ng/mL (ref 30.0–100.0)

## 2018-11-15 LAB — FOLATE: Folate: 8.2 ng/mL (ref >3.0)

## 2018-11-15 NOTE — Progress Notes (Signed)
Office: [phone]  /  Fax: [phone]    Date: November 20, 2018   Appointment Start Time: 9:03am Duration: 33 minutes Provider: Glennie Isle, Psy.D. Type of Session: Intake for Individual Therapy  Location of Patient: Work Location of Provider: Provider's Home Type of Contact: Telepsychological Visit via [employer]  Informed Consent: Prior to proceeding with today's appointment, two pieces of identifying information were obtained from Carly [name] to verify identity. In addition, Carly [name]'s physical location at the time of this appointment was obtained. In the event of technical difficulties, Carly [name] shared a phone number she could be reached at. [name] and this provider participated in today's telepsychological service. Also, [name] denied anyone else being present in the room or on the WebEx appointment.   The provider's role was explained to Carly [name]. The provider reviewed and discussed issues of confidentiality, privacy, and limits therein (e.g., reporting obligations). In addition to verbal informed consent, written informed consent for psychological services was obtained from [name] prior to the initial intake interview. Written consent included information concerning the practice, financial arrangements, and confidentiality and patients' rights. Since the clinic is not a 24/7 crisis center, mental health emergency resources were shared, and the provider explained MyChart, e-mail, voicemail, and/or other messaging systems should be utilized only for non-emergency reasons. This provider also explained that information obtained during appointments will be placed in [facility] medical record in a confidential manner and relevant information will be shared with other providers at [facility] that she meets with for coordination of care. Carly [name] verbally acknowledged understanding of the aforementioned, and agreed to use mental health emergency resources discussed if needed. Moreover,  Carly [name] agreed information may be shared with other [facility] providers as needed for coordination of care. By signing the service agreement document, Carly [name] provided written consent for coordination of care.   Prior to initiating telepsychological services, Carly [name] was provided with an informed consent document, which included the development of a safety plan (i.e., an emergency contact and emergency resources) in the event of an emergency/crisis. Carly [name] expressed understanding of the rationale of the safety plan and provided consent for this provider to reach out to her emergency contact in the event of an emergency/crisis. Carly [name] returned the completed consent form prior to today's appointment. This provider verbally reviewed the consent form during today's appointment prior to proceeding with the appointment. Carly [name] verbally acknowledged understanding that she is ultimately responsible for understanding her insurance benefits as it relates to reimbursement of telepsychological and in-person services. This provider also reviewed confidentiality, as it relates to telepsychological services, as well as the rationale for telepsychological services. More specifically, this provider's clinic is providing telepsychological services as an option for appointments to reduce exposure to COVID-19. [name] expressed understanding regarding the rationale for telepsychological services. In addition, this provider explained the telepsychological services informed consent document would be considered an addendum to the initial consent document/service agreement. Carly [name] verbally consented to proceed.   Chief Complaint/HPI: [name] was referred by Dr. Dennard Nip. During the initial appointment with Dr. Dennard Nip at [facility] on November 14, 2018, Carly [name] reported experiencing the following: significant food cravings issues , frequently drinking liquids with calories, frequently eating larger portions  than normal , binge eating behaviors, struggling with emotional eating and having problems with excessive hunger. Carly [name]'s Food and Mood (modified PHQ-9) score was 15.  During today's appointment, Carly [name] was verbally administered a questionnaire assessing various behaviors related to emotional eating. Carly [name] endorsed the following: overeat  when you are celebrating, eat certain foods when you are anxious, stressed, depressed, or your feelings are hurt, use food to help you cope with emotional situations, find food is comforting to you, overeat when you are angry or upset, overeat when you are worried about something, overeat frequently when you are bored or lonely and eat as a reward. She shared she craves "junk foods (e.g., chips, [place] onion dip)." Carly [name] believes the onset of emotional eating was after her father passed away approximately 12 years ago. She noted, "It's gotten worse in the last few years." She described the current frequency of emotional eating as "2-3 times a week." In addition, Carly [name] endorsed a history of binge eating. She reported, "Sometimes I come home and I'm just starving and I just eat what I can get my hands on." She described eating "junk food." She described the frequency of the aforementioned as "at least 3 times a week" and believes the onset was one year ago. Carly [name] denied a history of restricting food intake, purging and engagement in other compensatory strategies, and has never been diagnosed with an eating disorder. She also denied a history of treatment for emotional eating.  Moreover, Carly [name] indicated boredom triggers emotional eating, whereas focusing on housework or staying busy makes emotional eating better. Furthermore, Carly [name] denied other problems of concern.    Mental Status Examination:  Appearance: neat Behavior: cooperative Mood: euthymic Affect: mood congruent Speech: normal in rate, volume, and tone Eye Contact: appropriate Psychomotor Activity:  appropriate Thought Process: linear, logical, and goal directed  Content/Perceptual Disturbances: denies suicidal and homicidal ideation, plan, and intent and no hallucinations, delusions, bizarre thinking or behavior reported or observed Orientation: time, person, place and purpose of appointment Cognition/Sensorium: memory, attention, language, and fund of knowledge intact  Insight: good Judgment: good  Family & Psychosocial History: Keeshia reported she is married and she has two biological children (ages 27 and 35) and two step-daughters (ages 71 and 45). 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 Additionally, Shatonya shared her highest level of education obtained is "one year of college." Currently, Tyna's social support system consists of her husband, daughter, brothers, and sister in laws, and two co-workers. Moreover, Jazari stated she resides with her husband and daughter (age 76).   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     escitalopram (LEXAPRO) 10 MG tablet Take 10 mg by mouth daily.     esomeprazole (NEXIUM) 40 MG capsule TAKE 1 CAPSULE (40 MG TOTAL) BY MOUTH DAILY. 30 MINUTES BEFORE BREAKFAST 90 capsule 1   hyoscyamine (LEVSIN SL) 0.125 MG SL tablet 1-2  tablets under the tongue ever 6 -  8 hours as needed 30 tablet 2   lisinopril-hydrochlorothiazide (PRINZIDE,ZESTORETIC) 20-25 MG tablet Take 1 tablet by mouth daily.     meclizine (ANTIVERT) 25 MG tablet Take 1 tablet by mouth as needed.     norethindrone-ethinyl estradiol (CYCLAFEM) 0.5/0.75/1-35 MG-MCG tablet Take 1 tablet by mouth daily.     Current Facility-Administered Medications on File Prior to Visit  Medication Dose Route Frequency Provider Last Rate Last Dose   0.9 %  sodium chloride infusion  500 mL Intravenous Continuous Pyrtle, Lajuan Lines, MD       0.9 %  sodium chloride infusion  500 mL Intravenous Once Pyrtle, Lajuan Lines, MD      Mellisa denied a history of head injuries and loss of consciousness.    Mental Health History: Johnni denied a history of therapeutic services. Emilie denied a history of hospitalizations for psychiatric concerns, and has never met with a psychiatrist. Amarie reported she is prescribed Lexapro by her PCP and she described it as helpful. She stated she has been "on and off [of it] for 15 years." Genever denied a family history of mental health related concerns. Macayla denied a trauma history, including psychological, physical  and sexual abuse, as well as neglect.   Jinelle described her typical mood as "usually pretty happy, laid back, easy going." Aside from concerns noted above and endorsed on the PHQ-9 and GAD-7, Annalia reported experiencing crying spells; decreased self-image due to weight; and worry thoughts about her health and son's well-being. Layonna denied current alcohol use. She denied tobacco use. She denied illicit/recreational substance use. Regarding caffeine intake, Jolana reported consuming "at least two [employer], 2-3 a day" prior to starting with the clinic. Furthermore, Dalis denied experiencing the following: hopelessness, hallucinations and delusions, paranoia, symptoms of mania (e.g., expansive mood, flighty ideas, decreased need for sleep,  engagement in risky behaviors), angry outbursts, panic attacks and decreased motivation. She also denied history of and current suicidal ideation, plan, and intent; history of and current homicidal ideation, plan, and intent; and history of and current engagement in self-harm.  The following strengths were reported by [name]: personality, good listener, helpful, and caring. The following strengths were observed by this provider: ability to express thoughts and feelings during the therapeutic session, ability to establish and benefit from a therapeutic relationship, ability to learn and practice coping skills, willingness to work toward established goal(s) with the clinic and ability to engage in reciprocal conversation.  Legal History: Rande denied a history of legal involvement.   Structured Assessment Results: The Patient Health Questionnaire-9 (PHQ-9) is a self-report measure that assesses symptoms and severity of depression over the course of the last two weeks. Darl obtained a score of 5 suggesting mild depression. Irianna finds the endorsed symptoms to be not difficult at all. [name] 1  Feeling down, depressed, or hopeless 0  Trouble falling or staying asleep, or sleeping too much 0  Feeling tired or having little energy 3  [name] 0  Feeling bad about yourself --- or that you are a failure or have let yourself or your family down 1  Trouble concentrating on things, such as reading the newspaper or watching television 0  Moving or speaking so slowly that other people could have noticed? Or the opposite --- being so fidgety or restless that you have been moving around a lot more than usual 0  Thoughts that you would be better off dead or hurting yourself in  some way 0  PHQ-9 Score 5    The Generalized Anxiety Disorder-7 (GAD-7) is a brief self-report measure that assesses symptoms of anxiety over the course of the last two weeks. Jissell  obtained a score of 2 suggesting minimal anxiety. Doneta finds the endorsed symptoms to be not difficult at all. Feeling nervous, anxious, on edge 0  Not being able to stop or control worrying 0  Worrying too much about different things 1  Trouble relaxing 0  Being so restless that it's hard to sit still 0  Becoming easily annoyed or irritable 1  Feeling afraid as if something awful might happen 0  GAD-7 Score 2   Interventions: A chart review was conducted prior to the clinical intake interview. The PHQ-9, and GAD-7 were verbally administered as well as a Mood and Food questionnaire to assess various behaviors related to emotional eating. Throughout session, empathic reflections and validation was provided. Continuing treatment with this provider was discussed and a treatment goal was established. Psychoeducation regarding emotional versus physical hunger was provided. Keyleigh was sent a handout via e-mail to utilize between now and the next appointment to increase awareness of hunger patterns and subsequent eating. Tajia provided verbal consent during today's appointment for this provider to send the handout via e-mail.   Provisional DSM-5 Diagnosis: 311 (F32.8) Other Specified Depressive Disorder, Emotional Eating Behaviors  Plan: Eri appears able and willing to participate as evidenced by collaboration on a treatment goal, engagement in reciprocal conversation, and asking questions as needed for clarification. Molli is scheduled for a follow-up appointment with Dr. Leafy Ro on November 28, 2018; therefore, the next appointment with this provider will be scheduled in 2-3 weeks, which will be via [employer] Web[employer]. The following treatment goal was established: decrease emotional eating. For the aforementioned goal, Hadessah can benefit from individual therapy sessions that are brief in duration for approximately four to six sessions. The treatment modality will be individual therapeutic services, including an  eclectic therapeutic approach utilizing techniques from Cognitive Behavioral Therapy, Patient Centered Therapy, Dialectical Behavior Therapy, Acceptance and Commitment Therapy, Interpersonal Therapy, and Cognitive Restructuring. Therapeutic approach will include various interventions as appropriate, such as validation, support, mindfulness, thought defusion, reframing, psychoeducation, values assessment, and role playing. This provider will regularly review the treatment plan and medical chart to keep informed of status changes. Jonesha expressed understanding and agreement with the initial treatment plan of care.

## 2018-11-20 ENCOUNTER — Ambulatory Visit (INDEPENDENT_AMBULATORY_CARE_PROVIDER_SITE_OTHER): Payer: BC Managed Care – PPO | Admitting: Psychology

## 2018-11-20 ENCOUNTER — Other Ambulatory Visit: Payer: Self-pay

## 2018-11-20 DIAGNOSIS — F3289 Other specified depressive episodes: Secondary | ICD-10-CM | POA: Diagnosis not present

## 2018-11-27 ENCOUNTER — Other Ambulatory Visit: Payer: Self-pay | Admitting: Internal Medicine

## 2018-11-27 NOTE — Progress Notes (Signed)
Office: [phone]  /  Fax: [phone]    Date: December 07, 2018   Appointment Start Time: 8:38am Duration: 20 minutes Provider: Glennie Isle, Psy.D. Type of Session: Individual Therapy  Location of Patient: Work Biomedical scientist of Provider: [facility] Type of Contact: Telephone call  Session Content: Of note, this provider called Carly Carly [name] at 8:33am as she did not present for the WebEx appointment. Carly Carly [name] indicated she could not locate the e-mail; therefore, the e-mail with the secure link was re-sent and assistance to connect was provided. Nevertheless, connection could not be established; therefore, Carly Carly [name] provided verbal consent to proceed with the appointment via telephone. As such, today's appointment was initiated 8 minutes late. Carly Carly [name] is a 48 y.o. female presenting via a telephone call for a follow-up appointment to address the previously established treatment goal of decreasing emotional eating. Today's appointment was a telepsychological visit, as it is an option for appointments to reduce exposure to COVID-19. Carly Carly [name] expressed understanding regarding the rationale for telepsychological services, and provided verbal consent for today's appointment. Prior to proceeding with today's appointment, Carly Carly [name]'s physical location at the time of this appointment was obtained. In the event of technical difficulties, Carly [name] shared a phone number she could be reached at. Carly Carly [name] and this provider participated in today's telepsychological service. Also, Carly Carly [name] denied anyone else being present in the room or on the call.   This provider conducted a brief check-in and verbally administered the PHQ-9 and GAD-7. Carly Carly [name] reported she lost nine pounds and noted a reduction in emotional eating since the last appointment with this provider. Psychoeducation regarding triggers for emotional eating was provided. Carly Carly [name] was provided a handout, and encouraged to utilize the handout between now and the next  appointment to increase awareness of triggers and frequency. Carly Carly [name] agreed. This provider also discussed behavioral strategies for specific triggers, such as placing the utensil down when conversing to avoid mindless eating. Carly Carly [name] provided verbal consent during today's appointment for this provider to send a handout about triggers via e-mail. Carly Carly [name] was receptive to today's session as evidenced by openness to sharing, responsiveness to feedback, and willingness to explore triggers for emotional eating.  Mental Status Examination:  Appearance: unable to assess  Behavior: unable to assess Mood: euthymic Affect: unable to fully assess Speech: normal in rate, volume, and tone Eye Contact: unable to assess Psychomotor Activity: unable to assess Thought Process: linear, logical, and goal directed  Content/Perceptual Disturbances: no hallucinations, delusions, bizarre thinking or behavior reported or observed and no evidence of suicidal and homicidal ideation, plan, and intent Orientation: time, person, place and none Cognition/Sensorium: memory, attention, language, and fund of knowledge intact  Insight: good Judgment: good  Structured Assessment Results: The Patient Health Questionnaire-9 (PHQ-9) is a self-report measure that assesses symptoms and severity of depression over the course of the last two weeks. Carly Carly [name] obtained a score of 3 suggesting minimal depression. Carly Carly [name] finds the endorsed symptoms to be somewhat difficult. Carly Carly [name] 0  Feeling down, depressed, or hopeless 0  Trouble falling or staying asleep, or sleeping too much 0  Feeling tired or having Carly energy 3  [name] 0  Feeling bad about yourself --- or that you are a failure or have let yourself or your family down 0  Trouble concentrating on Carly [name], such as reading the newspaper or watching television 0  Moving or speaking so slowly that other people could have noticed? Or the  opposite --- being so fidgety or restless  that you have been moving around a lot more than usual 0  Thoughts that you would be better off dead or hurting yourself in some way 0  PHQ-9 Score 3    The Generalized Anxiety Disorder-7 (GAD-7) is a brief self-report measure that assesses symptoms of anxiety over the course of the last two weeks. Carly Carly [name] obtained a score of 1 suggesting minimal anxiety. Carly Carly [name] finds the endorsed symptoms to be somewhat difficult. Feeling nervous, anxious, on edge 0  Not being able to stop or control worrying 0  Worrying too much about different Carly [name] 1  Trouble relaxing 0  Being so restless that it's hard to sit still 0  Becoming easily annoyed or irritable 0  Feeling afraid as if something awful might happen 0  GAD-7 Score 1   Interventions:  Conducted a brief chart review Verbal administration of PHQ-9 and GAD-7 for symptom monitoring Provided empathic reflections and validation Reviewed content from the previous session Psychoeducation provided regarding triggers for emotional eating Employed motivational interviewing skills to assess patient's willingness/desire to adhere to recommended medical treatments and assignments Focused on rapport building Employed supportive psychotherapy interventions to facilitate reduced distress, and to improve coping skills with identified stressors  DSM-5 Diagnosis: 311 (F32.8) Other Specified Depressive Disorder, Emotional Eating Behaviors  Treatment Goal & Progress: During the initial appointment with this provider, the following treatment goal was established: decrease emotional eating. Carly [name] has demonstrated progress in her goal as evidenced by increased awareness of hunger patterns. Carly [name] also reported a reduction in emotional eating.  Plan: Carly Carly [name] continues to appear able and willing to participate as evidenced by engagement in reciprocal conversation, and asking questions for clarification as appropriate. The next  appointment will be scheduled in two weeks, which will be via [employer]. The next session will focus on reviewing learned skills, and working towards the established treatment goal.

## 2018-11-28 ENCOUNTER — Other Ambulatory Visit: Payer: Self-pay

## 2018-11-28 ENCOUNTER — Ambulatory Visit (INDEPENDENT_AMBULATORY_CARE_PROVIDER_SITE_OTHER): Payer: BC Managed Care – PPO | Admitting: Family Medicine

## 2018-11-28 ENCOUNTER — Encounter (INDEPENDENT_AMBULATORY_CARE_PROVIDER_SITE_OTHER): Payer: Self-pay | Admitting: Family Medicine

## 2018-11-28 VITALS — BP 115/74 | HR 98 | Temp 98.0°F | Ht 64.0 in | Wt 256.0 lb

## 2018-11-28 DIAGNOSIS — R7303 Prediabetes: Secondary | ICD-10-CM

## 2018-11-28 DIAGNOSIS — E538 Deficiency of other specified B group vitamins: Secondary | ICD-10-CM | POA: Diagnosis not present

## 2018-11-28 DIAGNOSIS — Z9189 Other specified personal risk factors, not elsewhere classified: Secondary | ICD-10-CM

## 2018-11-28 DIAGNOSIS — Z6841 Body Mass Index (BMI) 40.0 and over, adult: Secondary | ICD-10-CM

## 2018-11-28 DIAGNOSIS — E782 Mixed hyperlipidemia: Secondary | ICD-10-CM | POA: Diagnosis not present

## 2018-11-28 MED ORDER — METFORMIN HCL 500 MG PO TABS: 500.0000 mg | ORAL_TABLET | Freq: Every day | ORAL | 0 refills | Status: DC

## 2018-11-28 NOTE — Progress Notes (Signed)
Office: [phone]  /  Fax: [phone]   HPI:   Chief Complaint: OBESITY Carly [name] is here to discuss her progress with her obesity treatment plan. She is on the Category 4 plan and is following her eating plan approximately 99.9% of the time. She states she is exercising 0 minutes 0 times per week. January did very well following her diet prescription and reports hunger was controlled. She didn't like her breakfast options and would like to know if she can eat certain soups. Her weight is 256 lb (116.1 kg) today and has had a weight loss of 9 pounds over a period of 2 weeks since her last visit. She has lost 9 lbs since starting treatment with [place].  Hyperlipidemia, Mixed Carly [name] has hyperlipidemia with elevated triglycerides and has been trying to improve her cholesterol levels with intensive lifestyle modification including a low saturated fat diet, exercise and weight loss. She denies any abdominal pain, jaundice, or chest pain.  Vitamin B12 deficiency Carly [name] has a diagnosis of B12 insufficiency. This is a new diagnosis. She is not anemic and does not have a history of weight loss surgery. Traditionally she didn't eat much protein.  Pre-Diabetes Carly [name] has a new diagnosis of prediabetes based on her elevated Hgb A1c and was informed this puts her at greater risk of developing diabetes. She had elevated glucose, A1c, and insulin levels on her recent labs. She reports decreased polyphagia on her diet prescription.   At risk for diabetes Carly [name] is at higher than average risk for developing diabetes due to her obesity. She currently denies polyuria or polydipsia.  ASSESSMENT AND PLAN:  Mixed hyperlipidemia  B12 nutritional deficiency  Prediabetes - Plan: metFORMIN (GLUCOPHAGE) 500 MG tablet  At risk for diabetes mellitus  Class 3 severe obesity with serious comorbidity and body mass index (BMI) of 40.0 to 44.9 in adult, unspecified obesity type (HCC)  PLAN:  Hyperlipidemia,  Mixed Carly [name] was informed of the American Heart Association Guidelines emphasizing intensive lifestyle modifications as the first line treatment for hyperlipidemia. We discussed many lifestyle modifications today in depth, and Carly [name] will continue to work on decreasing saturated fats such as fatty red meat, butter and many fried foods. Carly [name] will continue her diet prescription and follow-up as directed to monitor her progress. She will also increase vegetables and lean protein in her diet and continue to work on exercise and weight loss efforts.  Vitamin B12 deficiency Carly [name] will continue a Vitamin B12 rich diet and follow-up as directed to monitor her progress.  Pre-Diabetes Carly [name] will continue to work on weight loss, exercise, and decreasing simple carbohydrates in her diet to help decrease the risk of diabetes. We dicussed metformin including benefits and risks. She was informed that eating too many simple carbohydrates or too many calories at one sitting increases the likelihood of GI side effects. Carly [name] will start metformin 500 mg QAM with food. She agrees to follow-up with our clinic in 2 weeks.  Diabetes risk counseling Carly [name] was given extended (30 minutes) diabetes prevention counseling today. She is 48 y.o. female and has risk factors for diabetes including obesity. We discussed intensive lifestyle modifications today with an emphasis on weight loss as well as increasing exercise and decreasing simple carbohydrates in her diet.  Obesity Carly [name] is currently in the action stage of change. As such, her goal is to continue with weight loss efforts. She has agreed to follow the Category 4 plan with breakfast options. Shredded chicken soup recipe was given. Carly [name] has been  instructed to work up to a goal of 150 minutes of combined cardio and strengthening exercise per week for weight loss and overall health benefits. We discussed the following Behavioral Modification Strategies today: increasing  lean protein intake and decreasing simple carbohydrates.  Carly [name] has agreed to follow-up with our clinic in 2 weeks. She was informed of the importance of frequent follow-up visits to maximize her success with intensive lifestyle modifications for her multiple health conditions.  ALLERGIES: Allergies  Allergen Reactions   Cephalosporins Rash    ceflex per the pt    MEDICATIONS: Current Outpatient Medications on File Prior to Visit  Medication Sig Dispense Refill   amitriptyline (ELAVIL) 25 MG tablet TAKE 2 TABLETS BY MOUTH EVERY EVENING 180 tablet 0   Cholecalciferol (VITAMIN D) 125 MCG (5000 UT) CAPS Take 1 capsule by mouth daily.     escitalopram (LEXAPRO) 10 MG tablet Take 10 mg by mouth daily.     esomeprazole (NEXIUM) 40 MG capsule TAKE 1 CAPSULE (40 MG TOTAL) BY MOUTH DAILY. 30 MINUTES BEFORE BREAKFAST 90 capsule 1   hyoscyamine (LEVSIN SL) 0.125 MG SL tablet 1-2 tablets under the tongue ever 6 - 8 hours as needed 30 tablet 2   lisinopril-hydrochlorothiazide (PRINZIDE,ZESTORETIC) 20-25 MG tablet Take 1 tablet by mouth daily.     meclizine (ANTIVERT) 25 MG tablet Take 1 tablet by mouth as needed.     norethindrone-ethinyl estradiol (CYCLAFEM) 0.5/0.75/1-35 MG-MCG tablet Take 1 tablet by mouth daily.     Current Facility-Administered Medications on File Prior to Visit  Medication Dose Route Frequency Provider Last Rate Last Dose   0.9 %  sodium chloride infusion  500 mL Intravenous Continuous Pyrtle, Lajuan Lines, MD       0.9 %  sodium chloride infusion  500 mL Intravenous Once Pyrtle, Lajuan Lines, MD        PAST MEDICAL HISTORY: Past Medical History:  Diagnosis Date   Back pain    Depression    Diverticulitis    Gastritis    GERD (gastroesophageal reflux disease)    Hepatic steatosis    Hypertension    Internal hemorrhoids    Sleep apnea    Swallowing difficulty    Tubular adenoma of colon    Vitamin D deficiency     PAST SURGICAL HISTORY: Past Surgical  History:  Procedure Laterality Date   CHOLECYSTECTOMY     SHOULDER ARTHROSCOPY     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      SOCIAL HISTORY: Social History   Tobacco Use   Smoking status: Never Smoker   Smokeless tobacco: Never Used  Substance Use Topics   Alcohol use: No   Drug use: No    FAMILY HISTORY: Family History  Problem Relation Age of Onset   Diabetes Father    Lung cancer Father    Bladder Cancer Father    Heart disease Father    Hypertension Father    Hyperlipidemia Father    Stroke Father    Obesity Father    Depression Mother    Diabetes Maternal Grandmother    Diabetes Paternal Grandmother    Colon cancer Paternal Aunt 56       decsd   Esophageal cancer Neg Hx    Rectal cancer Neg Hx    Stomach cancer Neg Hx    ROS: Review of Systems  Cardiovascular: Negative for chest pain.  Gastrointestinal: Negative for abdominal pain.  Skin:       Negative for jaundice.  PHYSICAL EXAM: Blood pressure 115/74, pulse 98, temperature 98 F (36.7 C), temperature source Oral, height 5\' 4"  (1.626 m), weight 256 lb (116.1 kg), SpO2 98 %. Body mass index is 43.94 kg/m. Physical Exam Vitals signs reviewed.  Constitutional:      Appearance: Normal appearance. She is obese.  Cardiovascular:     Rate and Rhythm: Normal rate.     Pulses: Normal pulses.  Pulmonary:     Effort: Pulmonary effort is normal.     Breath sounds: Normal breath sounds.  Musculoskeletal: Normal range of motion.  Skin:    General: Skin is warm and dry.  Neurological:     Mental Status: She is alert and oriented to person, place, and time.  Psychiatric:        Behavior: Behavior normal.   RECENT LABS AND TESTS: BMET    Component Value Date/Time   NA 137 11/14/2018 0905   K 4.7 11/14/2018 0905   CL 100 11/14/2018 0905   CO2 21 11/14/2018 0905   GLUCOSE 101 (H) 11/14/2018 0905   GLUCOSE 113 (H) 01/09/2018 1156   BUN 19 11/14/2018 0905   CREATININE 0.75  11/14/2018 0905   CALCIUM 9.2 11/14/2018 0905   GFRNONAA 95 11/14/2018 0905   GFRAA 110 11/14/2018 0905   Lab Results  Component Value Date   HGBA1C 6.0 (H) 11/14/2018   Lab Results  Component Value Date   INSULIN 43.3 (H) 11/14/2018   CBC    Component Value Date/Time   WBC 11.2 (H) 11/14/2018 0905   WBC 8.5 12/06/2017 0830   RBC 4.62 11/14/2018 0905   RBC 4.58 12/06/2017 0830   HGB 13.2 11/14/2018 0905   HCT 40.2 11/14/2018 0905   PLT 306 11/14/2018 0905   MCV 87 11/14/2018 0905   MCH 28.6 11/14/2018 0905   MCHC 32.8 11/14/2018 0905   MCHC 33.2 12/06/2017 0830   RDW 14.1 11/14/2018 0905   LYMPHSABS 1.7 11/14/2018 0905   MONOABS 0.6 12/06/2017 0830   EOSABS 0.2 11/14/2018 0905   BASOSABS 0.1 11/14/2018 0905   Iron/TIBC/Ferritin/ %Sat No results found for: IRON, TIBC, FERRITIN, IRONPCTSAT Lipid Panel     Component Value Date/Time   CHOL 199 11/14/2018 0905   TRIG 461 (H) 11/14/2018 0905   HDL 35 (L) 11/14/2018 0905   LDLCALC 88 11/14/2018 0905   Hepatic Function Panel     Component Value Date/Time   PROT 6.7 11/14/2018 0905   ALBUMIN 4.0 11/14/2018 0905   AST 21 11/14/2018 0905   ALT 16 11/14/2018 0905   ALKPHOS 79 11/14/2018 0905   BILITOT <0.2 11/14/2018 0905      Component Value Date/Time   TSH 1.080 11/14/2018 0905   Results for LAUREA, TITLOW (MRN [id number]) as of 11/28/2018 11:19  Ref. Range 11/14/2018 09:05  Vitamin D, 25-Hydroxy Latest Ref Range: 30.0 - 100.0 ng/mL 43.3   OBESITY BEHAVIORAL INTERVENTION VISIT  Today's visit was #2  Starting weight: 265 lbs Starting date: 11/14/2018 Today's weight: 256 lbs  Today's date: 11/28/2018 Total lbs lost to date: 9    11/28/2018  Height 5\' 4"  (1.626 m)  Weight 256 lb (116.1 kg)  BMI (Calculated) 43.92  BLOOD PRESSURE - SYSTOLIC [id number]  BLOOD PRESSURE - DIASTOLIC 74   Body Fat % 99991111 %  Total Body Water (lbs) 86 lbs   ASK: We discussed the diagnosis of obesity with [name] today  and Xinyan agreed to give [place] permission to discuss obesity behavioral modification therapy today.  ASSESS: Daniesha has the diagnosis of obesity and her BMI today is 44.1. Sadeen is in the action stage of change.   ADVISE: Jetaime was educated on the multiple health risks of obesity as well as the benefit of weight loss to improve her health. She was advised of the need for long term treatment and the importance of lifestyle modifications to improve her current health and to decrease her risk of future health problems.  AGREE: Multiple dietary modification options and treatment options were discussed and  Kashunda agreed to follow the recommendations documented in the above note.  ARRANGE: Lizania was educated on the importance of frequent visits to treat obesity as outlined per CMS and USPSTF guidelines and agreed to schedule her next follow up appointment today.  I, Michaelene Song, am acting as Location manager for Dennard Nip, MD   I have reviewed the above documentation for accuracy and completeness, and I agree with the above. -Dennard Nip, MD

## 2018-12-07 ENCOUNTER — Other Ambulatory Visit: Payer: Self-pay

## 2018-12-07 ENCOUNTER — Ambulatory Visit (INDEPENDENT_AMBULATORY_CARE_PROVIDER_SITE_OTHER): Payer: BC Managed Care – PPO | Admitting: Psychology

## 2018-12-07 ENCOUNTER — Encounter (INDEPENDENT_AMBULATORY_CARE_PROVIDER_SITE_OTHER): Payer: Self-pay | Admitting: Family Medicine

## 2018-12-07 DIAGNOSIS — F3289 Other specified depressive episodes: Secondary | ICD-10-CM | POA: Diagnosis not present

## 2018-12-07 NOTE — Telephone Encounter (Signed)
Please advise 

## 2018-12-10 NOTE — Progress Notes (Signed)
12/10/2018 BERNEDETTE [name] [id number] 1970-09-23   History of Present Illness: Carly [name]. Baldonado is a 48 year old female with a past medical history of depression hypertension, pre diabetes, sleep apnea, GERD, diverticulitis and colon polyps. Past cholecystectomy 2014. She was last seen in the office by Dr. Hilarie [name] with episodic epigastric pain, N/V and diarrhea. She was prescribed Amitriptyline 37m at bed time. She reports having less episodes of abdominal pain, nausea, dry heaves or vomiting and diarrhea since starting Amitriptyline. She previously had these attacks once monthly for the past year, typically occurred the 19th and 20th of each month which was approximately the third week of her BCP pack.  Her symptoms have significantly improved since starting amitriptyline.  She reported having 2 attacks since starting Amitriptyline. The most recent episode occurred on Thursday 12/05/2018. She described having epigastric pain which started at 8:30am, prior to eating breakfast. She went to work and her abdominal pain persistently worsened throughout the day. She left work at [employer] developing dry heaves. She got home and had 1 episode of nonbloody diarrhea. She continued to have dry heaves without vomiting. She ate mashed potatoes and soup for dinner. Her symptoms settled. The next day she felt a little sore. She passed a normal formed BM later in the day. No further episodes. Paternal aunt had colon cancer diagnosed late 68's She is intentionally trying to lose weight, [place] [facility] [place] [name] [facility] [place] [place] She has lost 9lbs in 2 to 3 weeks.   Labs 11/14/2018: Sodium 137.  Potassium 4.7.  Glucose 101.  BUN 19.  Creatinine 0.75.  Alk phos 79.  Albumin 4.0.  AST 21.  ALT 16.  Total bili 0.2.  Vitamin B12 159.  WBC 11.2.  Hemoglobin 13.2.  Hematocrit 40.2.  MCV 87.  Platelet 306.   EGD 12/20/2017 by Dr. PHilarie [name] - The examined esophagus was  normal. - The entire examined stomach was normal.  - Biopsies showed chronic inactive gastritis. No H. Pylori. - The examined duodenum was normal, biopsies not done.   Colonoscopy 04/19/2016 by Dr. PHilarie [name] - Two 3 to 4 mm polyps at the hepatic flexure and in the cecum, removed with a cold snare. Biopsies showed one tubular adenomatous polyp, one benign polypoid mucosa.  - Mild diverticulosis in the sigmoid colon, in the descending colon, in the proximal transverse  colon and at the hepatic flexure. - Small internal hemorrhoids. - The examination was otherwise normal. -Recall colonoscopy in 5 years.   Abdominal MRI/MRCP 12/04/2017: Status post cholecystectomy. No intrahepatic or extrahepatic ductal dilatation. Common duct measures 5 mm. Mild hepatic steatosis. Otherwise negative MRI abdomen.  Past Medical History:  Diagnosis Date  . Back pain   . Depression   . Diverticulitis   . Gastritis   . GERD (gastroesophageal reflux disease)   . Hepatic steatosis   . Hypertension   . Internal hemorrhoids   . Sleep apnea   . Swallowing difficulty   . Tubular adenoma of colon   . Vitamin D deficiency      Current Medications, Allergies, Past Medical History, Past Surgical History, Family History and Social History were reviewed in [facility].   Physical Exam: BP 118/84   Pulse 84   Temp 97.9 F (36.6 C)   Ht 5' 4"  (1.626 m)   Wt 257 lb 4 oz (116.7 kg)   BMI 44.16 kg/m   General: Well developed 48  year old female in no acute distress Head: Normocephalic and atraumatic Eyes:  sclerae anicteric, conjunctiva pink  Ears: Normal auditory acuity Lungs: Clear throughout to auscultation Heart: Regular rate and rhythm Abdomen: Soft, mild tenderness between the xiphoid and the umbilicus without rebound or guarding, no masses or hepatomegaly. Normal bowel sounds x 4 quadrants.  Note is for Rectal: Deferred Musculoskeletal: Symmetrical with no gross  deformities  Extremities: No edema  Neurological: Alert oriented x 4, grossly nonfocal Psychological:  Alert and cooperative. Normal mood and affect  Assessment and Recommendations:  69. 48 year old female with episodic upper abdominal pain, nausea/vomiting +/- diarrhea of unclear etiology, possibly associated to her birth control pills or Lisinopril -Patient to call office with next attack of upper abdominal pain, nausea, vomiting and diarrhea, at that time she will have laboratory studies to include CBC, CMP, CRP, amylase, lipase, C4, see 1 esterase inhibitor -Continue Amitriptyline 25 mg 2 tabs at bedtime  2. History of colon polyps -Next colonoscopy  due 04/2021  3. History of mild hepatis steatosis with normal LFTs.  She has lost 9 pounds in the past few weeks. -Continue follow-up with the [facility]  4. Pre diabetes

## 2018-12-12 ENCOUNTER — Ambulatory Visit: Payer: BC Managed Care – PPO | Admitting: Nurse Practitioner

## 2018-12-12 ENCOUNTER — Encounter (INDEPENDENT_AMBULATORY_CARE_PROVIDER_SITE_OTHER): Payer: Self-pay | Admitting: Family Medicine

## 2018-12-12 ENCOUNTER — Ambulatory Visit (INDEPENDENT_AMBULATORY_CARE_PROVIDER_SITE_OTHER): Payer: BC Managed Care – PPO | Admitting: Family Medicine

## 2018-12-12 ENCOUNTER — Other Ambulatory Visit: Payer: Self-pay

## 2018-12-12 ENCOUNTER — Encounter: Payer: Self-pay | Admitting: Nurse Practitioner

## 2018-12-12 VITALS — BP 118/84 | HR 84 | Temp 97.9°F | Ht 64.0 in | Wt 257.2 lb

## 2018-12-12 VITALS — BP 129/76 | HR 82 | Temp 98.1°F | Ht 64.0 in | Wt 252.0 lb

## 2018-12-12 DIAGNOSIS — R112 Nausea with vomiting, unspecified: Secondary | ICD-10-CM | POA: Diagnosis not present

## 2018-12-12 DIAGNOSIS — R197 Diarrhea, unspecified: Secondary | ICD-10-CM | POA: Diagnosis not present

## 2018-12-12 DIAGNOSIS — Z6841 Body Mass Index (BMI) 40.0 and over, adult: Secondary | ICD-10-CM

## 2018-12-12 DIAGNOSIS — R1013 Epigastric pain: Secondary | ICD-10-CM | POA: Diagnosis not present

## 2018-12-12 DIAGNOSIS — R7303 Prediabetes: Secondary | ICD-10-CM | POA: Diagnosis not present

## 2018-12-12 DIAGNOSIS — E538 Deficiency of other specified B group vitamins: Secondary | ICD-10-CM | POA: Diagnosis not present

## 2018-12-12 DIAGNOSIS — Z9189 Other specified personal risk factors, not elsewhere classified: Secondary | ICD-10-CM

## 2018-12-12 MED ORDER — AMITRIPTYLINE HCL 25 MG PO TABS: ORAL_TABLET | ORAL | 1 refills | Status: DC

## 2018-12-12 MED ORDER — METFORMIN HCL 500 MG PO TABS: 500.0000 mg | ORAL_TABLET | Freq: Every day | ORAL | 0 refills | Status: DC

## 2018-12-12 NOTE — Progress Notes (Signed)
Office: [phone]  /  Fax: [phone]    Date: December 25, 2018    Appointment Start Time: 1:59pm Duration: 23 minutes Provider: Glennie Isle, Psy.D. Type of Session: Individual Therapy  Location of Patient: Work Biomedical scientist of Provider: [facility] Type of Contact: Telepsychological Visit via [employer]  Session Content: Carly [name] is a 48 y.o. female presenting via [place] [place] for a follow-up appointment to address the previously established treatment goal of decreasing emotional eating. Today's appointment was a telepsychological visit due to COVID-19. Carly [name] provided verbal consent for today's telepsychological appointment and she is aware she is responsible for securing confidentiality on her end of the session. Prior to proceeding with today's appointment, Carly [name]'s physical location at the time of this appointment was obtained as well a phone number she could be reached at in the event of technical difficulties. Carly [name] and this provider participated in today's telepsychological service.   This provider conducted a brief check-in and verbally administered the PHQ-9 and GAD-7. Carly [name] shared, "I did pretty good for Thanksgiving," but shared feeling hungry in the past week. She wonders if her menstrual cycle has an impact on hunger and cravings. This was explored and she acknowledged deviations from the meal plan. This provider reflected that there may have been a reduction in protein intake, which contributed to hunger. Overall, Carly [name] reported a reduction in emotional eating. Triggers for emotional eating were reviewed. Psychoeducation regarding mindfulness was provided to assist with coping. A handout was provided to Carly [name] with further information regarding mindfulness, including exercises. This provider also explained the benefit of mindfulness as it relates to emotional eating. [name] was encouraged to engage in the provided exercises between now and the next appointment with  this provider. Carly [name] agreed. During today's appointment, Carly [name] was led through a mindfulness exercise involving her senses. Carly [name] provided verbal consent during today's appointment for this provider to send a handout about mindfulness via e-mail. Carly [name] was receptive to today's appointment as evidenced by openness to sharing, responsiveness to feedback, and willingness to engage in mindfulness exercises to assist with coping.  Mental Status Examination:  Appearance: appropriate grooming and hygiene and casually dressed Behavior: appropriate to circumstances Mood: euthymic Affect: mood congruent Speech: normal in rate, volume, and tone Eye Contact: appropriate Psychomotor Activity: appropriate Gait: unable to assess Thought Process: linear, logical, and goal directed  Thought Content/Perception: no hallucinations, delusions, bizarre thinking or behavior reported or observed and no evidence of suicidal and homicidal ideation, plan, and intent Orientation: time, person, place and purpose of appointment Memory/Concentration: memory, attention, language, and fund of knowledge intact  Insight/Judgment: good  Structured Assessments Results: The Patient Health Questionnaire-9 (PHQ-9) is a self-report measure that assesses symptoms and severity of depression over the course of the last two weeks. Carly [name] obtained a score of 3 suggesting minimal depression. Carly [name] finds the endorsed symptoms to be somewhat difficult. [0= Not at all; 1= Several days; 2= More than half the days; 3= Nearly every day] Carly [name] or pleasure in doing things 0  Feeling down, depressed, or hopeless 0  Trouble falling or staying asleep, or sleeping too much 0  Feeling tired or having Carly energy 2  [name] 1  Feeling bad about yourself --- or that you are a failure or have let yourself or your family down 0  Trouble concentrating on things, such as reading the newspaper or watching television 0  Moving  or speaking so slowly that other people could have noticed? Or the  opposite --- being so fidgety or restless that you have been moving around a lot more than usual 0  Thoughts that you would be better off dead or hurting yourself in some way 0  PHQ-9 Score 3    The Generalized Anxiety Disorder-7 (GAD-7) is a brief self-report measure that assesses symptoms of anxiety over the course of the last two weeks. Carly [name] obtained a score of 2 suggesting minimal anxiety. Carly [name] finds the endorsed symptoms to be not difficult at all. [0= Not at all; 1= Several days; 2= Over half the days; 3= Nearly every day] Feeling nervous, anxious, on edge 0  Not being able to stop or control worrying 0  Worrying too much about different things 2  Trouble relaxing 0  Being so restless that it's hard to sit still 0  Becoming easily annoyed or irritable 0  Feeling afraid as if something awful might happen 0  GAD-7 Score 2   Interventions:  Conducted a brief chart review Verbally administered PHQ-9 and GAD-7 for symptom monitoring Reviewed content from the previous session Employed supportive psychotherapy interventions to facilitate reduced distress and to improve coping skills with identified stressors Employed motivational interviewing skills to assess patient's willingness/desire to adhere to recommended medical treatments and assignments Psychoeducation provided regarding mindfulness Engaged patient in mindfulness exercise(s) Employed acceptance and commitment interventions to emphasize mindfulness and acceptance without struggle  DSM-5 Diagnosis: 311 (F32.8) Other Specified Depressive Disorder, Emotional Eating Behaviors  Treatment Goal & Progress: During the initial appointment with this provider, the following treatment goal was established: decrease emotional eating. [name] has demonstrated progress in her goal as evidenced by increased awareness of hunger patterns, triggers for emotional eating and reduction  in emotional eating. [name] also demonstrates willingness to engage in mindfulness exercises.  Plan: Due to the upcoming holidays and this provider being out of the office in the coming weeks, the next appointment will be scheduled in one month, which will be via [employer]. The next session will focus on mindfulness.

## 2018-12-12 NOTE — Progress Notes (Signed)
Addendum: Reviewed and agree with assessment and management plan. Kala Gassmann M, MD  

## 2018-12-12 NOTE — Progress Notes (Signed)
Office: [phone]  /  Fax: [phone]   HPI:   Chief Complaint: OBESITY Carly [name] is here to discuss her progress with her obesity treatment plan. She is on the Category 4 plan and is following her eating plan approximately 90% of the time. She states she is walking 1 mile 5 times per week. Carly [name] had a flare of GI issues a few weeks ago, which is now improved. She saw GI today who refilled her amitriptyline and did labs. She is tolerating 8-10 ounces of protein without issues and is tolerating metformin. Her weight is 252 lb (114.3 kg) today and has had a weight loss of 4 pounds over a period of 2 weeks since her last visit. She has lost 13 lbs since starting treatment with [place].  Pre-Diabetes Carly [name] has a diagnosis of prediabetes based on her elevated Hgb A1c and was informed this puts her at greater risk of developing diabetes. Last A1c 6.0 on 11/14/2018. She is taking metformin currently and continues to work on diet and exercise to decrease risk of diabetes. She denies nausea or hypoglycemia.  At risk for diabetes Carly [name] is at higher than average risk for developing diabetes due to her obesity. She currently denies polyuria or polydipsia.  Vitamin B12 deficiency Carly [name] has a diagnosis of B12 insufficiency. Last B12 was 159 on 11/14/2018. She is currently on a high Vitamin B diet.  Nausea and Vomiting, chronic and cyclical Carly [name] has chronic and cyclical nausea and vomiting, which is improved.   ASSESSMENT AND PLAN:  Prediabetes - Plan: metFORMIN (GLUCOPHAGE) 500 MG tablet  B12 nutritional deficiency  Nausea and vomiting, intractability of vomiting not specified, unspecified vomiting type  At risk for diabetes mellitus  Class 3 severe obesity with serious comorbidity and body mass index (BMI) of 40.0 to 44.9 in adult, unspecified obesity type [facility])  PLAN:  Pre-Diabetes Carly [name] will continue to work on weight loss, exercise, and decreasing simple carbohydrates in her diet to help  decrease the risk of diabetes. We dicussed metformin including benefits and risks. She was informed that eating too many simple carbohydrates or too many calories at one sitting increases the likelihood of GI side effects. Carly [name] was given a refill on her metformin 500 mg #30 with 0 refills and agrees to follow-up with our clinic in 2 weeks.  Diabetes risk counseling Carly [name] was given extended (15 minutes) diabetes prevention counseling today. She is 48 y.o. female and has risk factors for diabetes including obesity. We discussed intensive lifestyle modifications today with an emphasis on weight loss as well as increasing exercise and decreasing simple carbohydrates in her diet.  Vitamin B12 deficiency With metformin added on and Carly [name]'s GI issues, will monitor closely.   Nausea and Vomiting, chronic and cyclical Carly [name] has chronic and cyclical nausea and vomiting, which is improved.   Obesity Carly [name] is currently in the action stage of change. As such, her goal is to continue with weight loss efforts. She has agreed to follow the Category 4 plan. Carly [name] has been instructed to exercise as tolerated for weight loss and overall health benefits. We discussed the following Behavioral Modification Strategies today: increasing lean protein intake, decreasing simple carbohydrates, increasing vegetables, increase H20 intake, work on meal planning and easy cooking plans, and holiday eating strategies.  Carly [name] has agreed to follow-up with our clinic in 2 weeks. She was informed of the importance of frequent follow-up visits to maximize her success with intensive lifestyle modifications for her multiple health conditions.  ALLERGIES: Allergies  Allergen Reactions  . Cephalosporins Rash    ceflex per the pt    MEDICATIONS: Current Outpatient Medications on File Prior to Visit  Medication Sig Dispense Refill  . escitalopram (LEXAPRO) 10 MG tablet Take 10 mg by mouth daily.    Marland Kitchen esomeprazole (NEXIUM) 40 MG  capsule TAKE 1 CAPSULE (40 MG TOTAL) BY MOUTH DAILY. 30 MINUTES BEFORE BREAKFAST 90 capsule 1  . hyoscyamine (LEVSIN SL) 0.125 MG SL tablet 1-2 tablets under the tongue ever 6 - 8 hours as needed 30 tablet 2  . lisinopril-hydrochlorothiazide (PRINZIDE,ZESTORETIC) 20-25 MG tablet Take 1 tablet by mouth daily.    . meclizine (ANTIVERT) 25 MG tablet Take 1 tablet by mouth as needed.    . norethindrone-ethinyl estradiol (CYCLAFEM) 0.5/0.75/1-35 MG-MCG tablet Take 1 tablet by mouth daily.     Current Facility-Administered Medications on File Prior to Visit  Medication Dose Route Frequency Provider Last Rate Last Dose  . 0.9 %  sodium chloride infusion  500 mL Intravenous Once Pyrtle, Lajuan Lines, MD        PAST MEDICAL HISTORY: Past Medical History:  Diagnosis Date  . Back pain   . Depression   . Diverticulitis   . Gastritis   . GERD (gastroesophageal reflux disease)   . Hepatic steatosis   . Hypertension   . Internal hemorrhoids   . Sleep apnea   . Swallowing difficulty   . Tubular adenoma of colon   . Vitamin D deficiency     PAST SURGICAL HISTORY: Past Surgical History:  Procedure Laterality Date  . CHOLECYSTECTOMY    . SHOULDER ARTHROSCOPY    . 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 Smoking status: Never Smoker  . Smokeless tobacco: Never Used  Substance Use Topics  . Alcohol use: No  . Drug use: No    FAMILY HISTORY: Family History  Problem Relation Age of Onset  . Diabetes Father   . Lung cancer Father   . Bladder Cancer Father   . Heart disease Father   . Hypertension Father   . Hyperlipidemia Father   . Stroke Father   . Obesity Father   . Depression Mother   . Diabetes Maternal Grandmother   . Diabetes Paternal Grandmother   . Colon cancer Paternal Aunt 66       decsd  . Esophageal cancer Neg Hx   . Rectal cancer Neg Hx   . Stomach cancer Neg Hx    ROS: Review of Systems  Gastrointestinal: Positive  for nausea and vomiting.  Endo/Heme/Allergies:       Negative for hypoglycemia.   PHYSICAL EXAM: Blood pressure 129/76, pulse 82, temperature 98.1 F (36.7 C), temperature source Oral, height 5\' 4"  (1.626 m), weight 252 lb (114.3 kg), SpO2 97 %. Body mass index is 43.26 kg/m. Physical Exam Vitals signs reviewed.  Constitutional:      Appearance: Normal appearance. She is obese.  Cardiovascular:     Rate and Rhythm: Normal rate.     Pulses: Normal pulses.  Pulmonary:     Effort: Pulmonary effort is normal.     Breath sounds: Normal breath sounds.  Musculoskeletal: Normal range of motion.  Skin:    General: Skin is warm and dry.  Neurological:     Mental Status: She is alert and oriented to person, place, and time.  Psychiatric:        Behavior: Behavior normal.   RECENT  LABS AND TESTS: BMET    Component Value Date/Time   NA 137 11/14/2018 0905   K 4.7 11/14/2018 0905   CL 100 11/14/2018 0905   CO2 21 11/14/2018 0905   GLUCOSE 101 (H) 11/14/2018 0905   GLUCOSE 113 (H) 01/09/2018 1156   BUN 19 11/14/2018 0905   CREATININE 0.75 11/14/2018 0905   CALCIUM 9.2 11/14/2018 0905   GFRNONAA 95 11/14/2018 0905   GFRAA 110 11/14/2018 0905   Lab Results  Component Value Date   HGBA1C 6.0 (H) 11/14/2018   Lab Results  Component Value Date   INSULIN 43.3 (H) 11/14/2018   CBC    Component Value Date/Time   WBC 11.2 (H) 11/14/2018 0905   WBC 8.5 12/06/2017 0830   RBC 4.62 11/14/2018 0905   RBC 4.58 12/06/2017 0830   HGB 13.2 11/14/2018 0905   HCT 40.2 11/14/2018 0905   PLT 306 11/14/2018 0905   MCV 87 11/14/2018 0905   MCH 28.6 11/14/2018 0905   MCHC 32.8 11/14/2018 0905   MCHC 33.2 12/06/2017 0830   RDW 14.1 11/14/2018 0905   LYMPHSABS 1.7 11/14/2018 0905   MONOABS 0.6 12/06/2017 0830   EOSABS 0.2 11/14/2018 0905   BASOSABS 0.1 11/14/2018 0905   Iron/TIBC/Ferritin/ %Sat No results found for: IRON, TIBC, FERRITIN, IRONPCTSAT Lipid Panel     Component Value  Date/Time   CHOL 199 11/14/2018 0905   TRIG 461 (H) 11/14/2018 0905   HDL 35 (L) 11/14/2018 0905   LDLCALC 88 11/14/2018 0905   Hepatic Function Panel     Component Value Date/Time   PROT 6.7 11/14/2018 0905   ALBUMIN 4.0 11/14/2018 0905   AST 21 11/14/2018 0905   ALT 16 11/14/2018 0905   ALKPHOS 79 11/14/2018 0905   BILITOT <0.2 11/14/2018 0905      Component Value Date/Time   TSH 1.080 11/14/2018 0905   Results for LOANNE, LAMARRE (MRN [id number]) as of 12/12/2018 12:20  Ref. Range 11/14/2018 09:05  Vitamin D, 25-Hydroxy Latest Ref Range: 30.0 - 100.0 ng/mL 43.3   OBESITY BEHAVIORAL INTERVENTION VISIT  Today's visit was #3  Starting weight: 265 lbs Starting date: 11/14/2018 Today's weight: 252 lbs  Today's date: 12/12/2018 Total lbs lost to date: 13     12/12/2018  Height 5\' 4"  (1.626 m)  Weight 252 lb (114.3 kg)  BMI (Calculated) 43.23  BLOOD PRESSURE - SYSTOLIC [id number]  BLOOD PRESSURE - DIASTOLIC 84   Body Fat % [id number] %  Total Body Water (lbs) 85 lbs   ASK: We discussed the diagnosis of obesity with [name] today and Winona agreed to give [place] permission to discuss obesity behavioral modification therapy today.  ASSESS: Gwennyth has the diagnosis of obesity and her BMI today is 43.4. Monica is in the action stage of change.   ADVISE: Divine was educated on the multiple health risks of obesity as well as the benefit of weight loss to improve her health. She was advised of the need for long term treatment and the importance of lifestyle modifications to improve her current health and to decrease her risk of future health problems.  AGREE: Multiple dietary modification options and treatment options were discussed and  Cassi agreed to follow the recommendations documented in the above note.  ARRANGE: Kamyla was educated on the importance of frequent visits to treat obesity as outlined per CMS and USPSTF guidelines and agreed to schedule her next follow up  appointment today.  Migdalia Dk, am acting as  transcriptionist for [employer]. Juleen China, DO  I have reviewed the above documentation for accuracy and completeness, and I agree with the above. Briscoe Deutscher, DO

## 2018-12-12 NOTE — Patient Instructions (Signed)
If you are age 47 or older, your body mass index should be between 23-30. Your Body mass index is 44.16 kg/m. If this is out of the aforementioned range listed, please consider follow up with your Primary Care Provider.  If you are age 83 or younger, your body mass index should be between 19-25. Your Body mass index is 44.16 kg/m. If this is out of the aformentioned range listed, please consider follow up with your Primary Care Provider.   We have placed orders for you to have labs drawn if you have any further abdominal pain, nausea, dry heaves and diarrhea.  We have sent the following medications to your pharmacy for you to pick up at your convenience:  Amitriptyline  25 mg   Follow up with Dr Hilarie Fredrickson in 6 months. Please call to schedule this in May 2021.

## 2018-12-20 ENCOUNTER — Other Ambulatory Visit (INDEPENDENT_AMBULATORY_CARE_PROVIDER_SITE_OTHER): Payer: Self-pay | Admitting: Family Medicine

## 2018-12-20 DIAGNOSIS — R7303 Prediabetes: Secondary | ICD-10-CM

## 2018-12-25 ENCOUNTER — Ambulatory Visit (INDEPENDENT_AMBULATORY_CARE_PROVIDER_SITE_OTHER): Payer: BC Managed Care – PPO | Admitting: Psychology

## 2018-12-25 ENCOUNTER — Other Ambulatory Visit: Payer: Self-pay

## 2018-12-25 DIAGNOSIS — F3289 Other specified depressive episodes: Secondary | ICD-10-CM

## 2019-01-02 ENCOUNTER — Other Ambulatory Visit: Payer: Self-pay

## 2019-01-02 ENCOUNTER — Encounter (INDEPENDENT_AMBULATORY_CARE_PROVIDER_SITE_OTHER): Payer: Self-pay | Admitting: Family Medicine

## 2019-01-02 ENCOUNTER — Ambulatory Visit (INDEPENDENT_AMBULATORY_CARE_PROVIDER_SITE_OTHER): Payer: BC Managed Care – PPO | Admitting: Family Medicine

## 2019-01-02 VITALS — BP 127/78 | HR 83 | Temp 97.8°F | Ht 64.0 in | Wt 248.0 lb

## 2019-01-02 DIAGNOSIS — Z9189 Other specified personal risk factors, not elsewhere classified: Secondary | ICD-10-CM

## 2019-01-02 DIAGNOSIS — E7849 Other hyperlipidemia: Secondary | ICD-10-CM | POA: Diagnosis not present

## 2019-01-02 DIAGNOSIS — R7303 Prediabetes: Secondary | ICD-10-CM

## 2019-01-02 DIAGNOSIS — E559 Vitamin D deficiency, unspecified: Secondary | ICD-10-CM | POA: Diagnosis not present

## 2019-01-02 DIAGNOSIS — I10 Essential (primary) hypertension: Secondary | ICD-10-CM

## 2019-01-02 DIAGNOSIS — Z6841 Body Mass Index (BMI) 40.0 and over, adult: Secondary | ICD-10-CM

## 2019-01-02 DIAGNOSIS — E538 Deficiency of other specified B group vitamins: Secondary | ICD-10-CM | POA: Diagnosis not present

## 2019-01-02 DIAGNOSIS — E66813 Obesity, class 3: Secondary | ICD-10-CM

## 2019-01-02 MED ORDER — METFORMIN HCL 500 MG PO TABS: 500.0000 mg | ORAL_TABLET | Freq: Every day | ORAL | 0 refills | Status: DC

## 2019-01-03 NOTE — Progress Notes (Signed)
Office: [phone]  /  Fax: [phone]   HPI:  Chief Complaint: OBESITY Carly [name] is here to discuss her progress with her obesity treatment plan. She is on the Category 4 plan and states she is following her eating plan approximately 80 % of the time. She states she is walking 1 mile 5 times per week.  Carly [name] is doing well. She has some questions about prepared meals (not frozen). She is not having any more abdominal issues. Carly [name] is taking IBS medicines. Carly [name] would like to have hot chocolate. We discussed the bariatric version with protein and adding Fairlife milk, while taking from snack calories.  Today's visit was #  4 Starting weight: 265 lbs  Starting date: 11/14/18 Today's weight : Weight: 248 lb (112.5 kg)  Today's date: 01/02/2019 Total lbs lost to date: 17 Total lbs lost since last in-office visit: 4  Pre-Diabetes Carly [name] last A1c was 6.0 on 11/14/18. Carly [name] is taking metformin 500 mg PO daily. She notes hyperphagia during PMS.  At risk for diabetes Carly [name] is at higher than average risk for developing diabetes due to her pre-diabetes and obesity.   Vitamin B12 Deficiency Carly [name] last B12 level was 159 on 11/14/18.   Hyperlipidemia Carly [name] is not on a statin.  Hypertension Carly [name] is taking lisinopril-HCTZ and her blood pressure is at 127/78.  ASSESSMENT AND PLAN:  Prediabetes - Plan: metFORMIN (GLUCOPHAGE) 500 MG tablet  B12 nutritional deficiency  Vitamin D deficiency  Other hyperlipidemia  At risk for diabetes mellitus  Essential hypertension  Class 3 severe obesity with serious comorbidity and body mass index (BMI) of 40.0 to 44.9 in adult, unspecified obesity type [facility])  PLAN:  Pre-Diabetes Carly [name] will continue to work on weight loss, exercise, and decreasing simple carbohydrates to help decrease the risk of diabetes. Carly [name] agreed to continue taking metformin 500 mg daily #30 with no refills. Carly [name] agreed to follow up in 2 weeks.  Diabetes risk  counseling (~15 min) Carly [name] is a 48 y.o. female and has risk factors for diabetes including pre-diabetes and obesity. We discussed intensive lifestyle modifications today with an emphasis on weight loss as well as increasing exercise and decreasing simple carbohydrates in her diet.  Vitamin B12 Deficiency We will continue to monitor Carly [name] now that she is on a high B vitamin diet and she agrees to return at the agreed upon time.   Hyperlipidemia Intensive lifestyle modifications as the first line treatment for hyperlipidemia. We discussed many lifestyle modifications today and Carly [name] will continue to work on diet, exercise, and weight loss efforts. We will monitor her at her next visit.  Hypertension Carly [name] is working on healthy weight loss and exercise to improve blood pressure control. We will watch for signs of hypotension as she continues her lifestyle modifications. We will continue to monitor her and she will follow up as directed.  Obesity Carly [name] is currently in the action stage of change. As such, her goal is to continue with weight loss efforts. She has agreed to follow the Category 4 plan. Carly [name] has been instructed to continue walking 3,000 to 5,000 steps. We discussed the following Behavioral Modification Strategies today: decreasing sodium intake and work on meal planning and easy cooking plans.  Carly [name] has agreed to follow up with our clinic in 2 weeks. She was informed of the importance of frequent follow up visits to maximize her success with intensive lifestyle modifications for her multiple health conditions.  ALLERGIES: Allergies  Allergen Reactions  . Cephalosporins Rash   MEDICATIONS:  Current Outpatient Medications on File Prior to Visit  Medication Sig Dispense Refill  . amitriptyline (ELAVIL) 25 MG tablet Take 2 at bedtime 180 tablet 1  . escitalopram (LEXAPRO) 10 MG tablet Take 10 mg by mouth daily.    Marland Kitchen esomeprazole (NEXIUM) 40 MG capsule TAKE 1 CAPSULE (40 MG TOTAL)  BY MOUTH DAILY. 30 MINUTES BEFORE BREAKFAST 90 capsule 1  . hyoscyamine (LEVSIN SL) 0.125 MG SL tablet 1-2 tablets under the tongue ever 6 - 8 hours as needed 30 tablet 2  . lisinopril-hydrochlorothiazide (PRINZIDE,ZESTORETIC) 20-25 MG tablet Take 1 tablet by mouth daily.    . meclizine (ANTIVERT) 25 MG tablet Take 1 tablet by mouth as needed.    . norethindrone-ethinyl estradiol (CYCLAFEM) 0.5/0.75/1-35 MG-MCG tablet Take 1 tablet by mouth daily.    Marland Kitchen VITAMIN D, CHOLECALCIFEROL, PO Take 1,000 Int'l Units/day by mouth daily.     Current Facility-Administered Medications on File Prior to Visit  Medication Dose Route Frequency Provider Last Rate Last Admin  . 0.9 %  sodium chloride infusion  500 mL Intravenous Once Pyrtle, Lajuan Lines, MD       PAST MEDICAL HISTORY: Past Medical History:  Diagnosis Date  . Back pain   . Depression   . Diverticulitis   . Gastritis   . GERD (gastroesophageal reflux disease)   . Hepatic steatosis   . Hypertension   . Internal hemorrhoids   . Sleep apnea   . Tubular adenoma of colon   . Vitamin D deficiency    PAST SURGICAL HISTORY: Past Surgical History:  Procedure Laterality Date  . CHOLECYSTECTOMY    . MICROLARYNGOSCOPY WITH CO2 LASER AND EXCISION OF VOCAL CORD LESION    . SHOULDER ARTHROSCOPY     SOCIAL HISTORY: Social History   Tobacco Use  . Smoking status: Never Smoker  . Smokeless tobacco: Never Used  Substance Use Topics  . Alcohol use: No  . Drug use: No   FAMILY HISTORY: Family History  Problem Relation Age of Onset  . Diabetes Father   . Lung cancer Father   . Bladder Cancer Father   . Heart disease Father   . Hypertension Father   . Hyperlipidemia Father   . Stroke Father   . Obesity Father   . Depression Mother   . Diabetes Maternal Grandmother   . Diabetes Paternal Grandmother   . Colon cancer Paternal Aunt 68  . Esophageal cancer Neg Hx   . Rectal cancer Neg Hx   . Stomach cancer Neg Hx    ROS: Review of Systems    Constitutional: Positive for weight loss.  Endo/Heme/Allergies:       Positive for Hyperphagia.   PHYSICAL EXAM: Blood pressure 127/78, pulse 83, temperature 97.8 F (36.6 C), temperature source Oral, height 5\' 4"  (1.626 m), weight 248 lb (112.5 kg), last menstrual period 12/19/2018, SpO2 98 %. Body mass index is 42.57 kg/m.   Physical Exam Vitals and nursing note reviewed.  HENT:     Head: Normocephalic and atraumatic.  Eyes:     Conjunctiva/sclera: Conjunctivae normal.  Cardiovascular:     Rate and Rhythm: Normal rate and regular rhythm.  Pulmonary:     Effort: Pulmonary effort is normal.  Abdominal:     Palpations: Abdomen is soft.  Musculoskeletal:     Cervical back: Normal range of motion and neck supple.  Neurological:     General: No focal deficit present.  Psychiatric:        Behavior: Behavior normal.  RECENT LABS AND TESTS: BMET    Component Value Date/Time   NA 137 11/14/2018 0905   K 4.7 11/14/2018 0905   CL 100 11/14/2018 0905   CO2 21 11/14/2018 0905   GLUCOSE 101 (H) 11/14/2018 0905   GLUCOSE 113 (H) 01/09/2018 1156   BUN 19 11/14/2018 0905   CREATININE 0.75 11/14/2018 0905   CALCIUM 9.2 11/14/2018 0905   GFRNONAA 95 11/14/2018 0905   GFRAA 110 11/14/2018 0905   Lab Results  Component Value Date   HGBA1C 6.0 (H) 11/14/2018   Lab Results  Component Value Date   INSULIN 43.3 (H) 11/14/2018   CBC    Component Value Date/Time   WBC 11.2 (H) 11/14/2018 0905   WBC 8.5 12/06/2017 0830   RBC 4.62 11/14/2018 0905   RBC 4.58 12/06/2017 0830   HGB 13.2 11/14/2018 0905   HCT 40.2 11/14/2018 0905   PLT 306 11/14/2018 0905   MCV 87 11/14/2018 0905   MCH 28.6 11/14/2018 0905   MCHC 32.8 11/14/2018 0905   MCHC 33.2 12/06/2017 0830   RDW 14.1 11/14/2018 0905   LYMPHSABS 1.7 11/14/2018 0905   MONOABS 0.6 12/06/2017 0830   EOSABS 0.2 11/14/2018 0905   BASOSABS 0.1 11/14/2018 0905   Iron/TIBC/Ferritin/ %Sat No results found for: IRON, TIBC,  FERRITIN, IRONPCTSAT Lipid Panel     Component Value Date/Time   CHOL 199 11/14/2018 0905   TRIG 461 (H) 11/14/2018 0905   HDL 35 (L) 11/14/2018 0905   LDLCALC 88 11/14/2018 0905   Hepatic Function Panel     Component Value Date/Time   PROT 6.7 11/14/2018 0905   ALBUMIN 4.0 11/14/2018 0905   AST 21 11/14/2018 0905   ALT 16 11/14/2018 0905   ALKPHOS 79 11/14/2018 0905   BILITOT <0.2 11/14/2018 0905      Component Value Date/Time   TSH 1.080 11/14/2018 0905   Results for HAWANYA, HECKSEL (MRN [id number]) as of 01/03/2019 13:33  Ref. Range 11/14/2018 09:05  Vitamin D, 25-Hydroxy Latest Ref Range: 30.0 - 100.0 ng/mL 43.3    I, Marcille Blanco, CMA, am acting as Location manager for Briscoe Deutscher, DO  I have reviewed the above documentation for accuracy and completeness, and I agree with the above. Briscoe Deutscher, DO

## 2019-01-05 ENCOUNTER — Encounter (INDEPENDENT_AMBULATORY_CARE_PROVIDER_SITE_OTHER): Payer: Self-pay | Admitting: Family Medicine

## 2019-01-22 ENCOUNTER — Other Ambulatory Visit: Payer: Self-pay

## 2019-01-22 ENCOUNTER — Ambulatory Visit (INDEPENDENT_AMBULATORY_CARE_PROVIDER_SITE_OTHER): Payer: BC Managed Care – PPO | Admitting: Family Medicine

## 2019-01-22 VITALS — BP 119/77 | HR 97 | Temp 97.8°F | Ht 64.0 in | Wt 247.0 lb

## 2019-01-22 DIAGNOSIS — E559 Vitamin D deficiency, unspecified: Secondary | ICD-10-CM

## 2019-01-22 DIAGNOSIS — R7303 Prediabetes: Secondary | ICD-10-CM | POA: Diagnosis not present

## 2019-01-22 DIAGNOSIS — Z6841 Body Mass Index (BMI) 40.0 and over, adult: Secondary | ICD-10-CM

## 2019-01-22 DIAGNOSIS — E7849 Other hyperlipidemia: Secondary | ICD-10-CM

## 2019-01-22 DIAGNOSIS — Z9189 Other specified personal risk factors, not elsewhere classified: Secondary | ICD-10-CM | POA: Diagnosis not present

## 2019-01-22 DIAGNOSIS — E538 Deficiency of other specified B group vitamins: Secondary | ICD-10-CM

## 2019-01-22 DIAGNOSIS — I10 Essential (primary) hypertension: Secondary | ICD-10-CM

## 2019-01-22 NOTE — Progress Notes (Signed)
Office: [phone]  /  Fax: [phone]    Date: January 24, 2019   Appointment Start Time: 11:31am Duration: 21 minutes Provider: Glennie Isle, Psy.D. Type of Session: Individual Therapy  Location of Patient: Home Location of Provider: [facility] Type of Contact: Telepsychological Visit via [employer]  Session Content: Carly [name] is a 49 y.o. female presenting via [place] for a follow-up appointment to address the previously established treatment goal of decreasing emotional eating. Today's appointment was a telepsychological visit due to COVID-19. Carly [name] provided verbal consent for today's telepsychological appointment and she is aware she is responsible for securing confidentiality on her end of the session. Prior to proceeding with today's appointment, Carly [name]'s physical location at the time of this appointment was obtained as well a phone number she could be reached at in the event of technical difficulties. Carly [name] and this provider participated in today's telepsychological service.   This provider conducted a brief check-in and verbally administered the PHQ-9 and GAD-7. Carly [name] shared about recent events. She described deviations from the meal plan during the holidays, but described making better choices and engaging in portion control. She also reported engagement in mindfulness exercises has helped improve emotional eating and has helped with feeling better about herself. Psychoeducation regarding formal (e.g., setting aside a specific time daily to engage in an exercise) and informal (e.g., cultivating awareness in the present moment and taking a non-judgmental approach while engaging in day-to-day tasks) mindfulness was provided. This provider discussed the utilization of YouTube for mindfulness exercises (e.g., videos by Merri Ray) to assist with formal practice. Additionally, Carly [name] was led through a mindfulness exercise focusing on her breath as an anchor. Carly [name]'s  experience was processed. Furthermore, termination planning was discussed. Carly [name] was receptive to a follow-up appointment in 3-4 weeks and an additional follow-up/termination appointment in 3-4 weeks after that. Overall, Carly [name] was receptive to today's appointment as evidenced by openness to sharing, responsiveness to feedback, and willingness to engage in mindfulness exercises to assist with coping.  Mental Status Examination:  Appearance: well groomed and appropriate hygiene  Behavior: appropriate to circumstances Mood: euthymic Affect: mood congruent Speech: normal in rate, volume, and tone Eye Contact: appropriate Psychomotor Activity: appropriate Gait: unable to assess Thought Process: linear, logical, and goal directed  Thought Content/Perception: no hallucinations, delusions, bizarre thinking or behavior reported or observed and no evidence of suicidal and homicidal ideation, plan, and intent Orientation: time, person, place and purpose of appointment Memory/Concentration: memory, attention, language, and fund of knowledge intact  Insight/Judgment: good  Structured Assessments Results: The Patient Health Questionnaire-9 (PHQ-9) is a self-report measure that assesses symptoms and severity of depression over the course of the last two weeks. Carly [name] obtained a score of 1 suggesting minimal depression. Carly [name] finds the endorsed symptoms to be not difficult at all. [0= Not at all; 1= Several days; 2= More than half the days; 3= Nearly every day] [name] 0  Feeling down, depressed, or hopeless 0  Trouble falling or staying asleep, or sleeping too much 0  Feeling tired or having little energy 1  [name] 0  Feeling bad about yourself --- or that you are a failure or have let yourself or your family down 0  Trouble concentrating on things, such as reading the newspaper or watching television 0  Moving or speaking so slowly that other people  could have noticed? Or the opposite --- being so fidgety or restless that you have been  moving around a lot more than usual 0  Thoughts that you would be better off dead or hurting yourself in some way 0  PHQ-9 Score 1    The Generalized Anxiety Disorder-7 (GAD-7) is a brief self-report measure that assesses symptoms of anxiety over the course of the last two weeks. Carly [name] obtained a score of 2 suggesting minimal anxiety. Carly [name] finds the endorsed symptoms to be not difficult at all. [0= Not at all; 1= Several days; 2= Over half the days; 3= Nearly every day] Feeling nervous, anxious, on edge 0  Not being able to stop or control worrying 2  Worrying too much about different things 0  Trouble relaxing 0  Being so restless that it's hard to sit still 0  Becoming easily annoyed or irritable 0  Feeling afraid as if something awful might happen 0  GAD-7 Score 2   Interventions:  Conducted a brief chart review Verbally administered PHQ-9 and GAD-7 for symptom monitoring Provided empathic reflections and validation Reviewed content from the previous session Provided positive reinforcement Employed supportive psychotherapy interventions to facilitate reduced distress and to improve coping skills with identified stressors Employed motivational interviewing skills to assess patient's willingness/desire to adhere to recommended medical treatments and assignments Engaged patient in mindfulness exercise(s) Discussed termination planning  DSM-5 Diagnosis: 311 (F32.8) Other Specified Depressive Disorder, Emotional Eating Behaviors  Treatment Goal & Progress: During the initial appointment with this provider, the following treatment goal was established: decrease emotional eating. Carly [name] has demonstrated progress in her goal as evidenced by increased awareness of hunger patterns, increased awareness of triggers for emotional eating and reduction in emotional eating. Carly [name] also demonstrates willingness to  engage in mindfulness exercises.  Plan: The next appointment will be scheduled in three weeks, which will be via [employer]. The next session will focus on working towards the established treatment goal.

## 2019-01-23 ENCOUNTER — Encounter (INDEPENDENT_AMBULATORY_CARE_PROVIDER_SITE_OTHER): Payer: Self-pay

## 2019-01-23 NOTE — Progress Notes (Signed)
Chief Complaint: OBESITY Carly [name] is here to discuss her progress with her obesity treatment plan. Carly [name] is on the Category 4 Plan and states she is following her eating plan approximately 75-80% of the time. Carly [name] states she is moving for 10 minutes 1 time per week.  Today's visit was #: 5 Starting weight: 265 lbs Starting date: 11/14/2018 Today's weight: 247 lbs Today's date: 01/23/2019 Total lbs lost to date: 18 lbs Total lbs lost since last in-office visit: 1 lb  Interim History: Carly [name] has done well overall this month.  It is time for her to incorporate exercise into her routine.  Subjective:   1. Prediabetes Carly [name] has a diagnosis of prediabetes based on her elevated HgA1c and was informed this puts her at greater risk of developing diabetes. She continues to work on diet and exercise to decrease her risk of diabetes. She denies nausea or hypoglycemia.  2. Vitamin D deficiency She's Vitamin D level was 43.3 on 11/14/18. She is currently taking vit D. She denies nausea, vomiting or muscle weakness.  3. Other hyperlipidemia Carly [name] has hyperlipidemia and has been trying to improve her cholesterol levels with intensive lifestyle modification including a low saturated fat diet, exercise and weight loss. She denies any chest pain, claudication or myalgias.  Lab Results  Component Value Date   ALT 16 11/14/2018   AST 21 11/14/2018   ALKPHOS 79 11/14/2018   BILITOT <0.2 11/14/2018   Lab Results  Component Value Date   CHOL 199 11/14/2018   HDL 35 (L) 11/14/2018   LDLCALC 88 11/14/2018   TRIG 461 (H) 11/14/2018   4. Essential hypertension Review: no chest pain on exertion, no dyspnea on exertion, no swelling of ankles.   BP Readings from Last 3 Encounters:  01/22/19 119/77  01/02/19 127/78  12/12/18 129/76   5. B12 nutritional deficiency  Lab Results  Component Value Date   VITAMINB12 159 (L) 11/14/2018   Assessment/Plan:   1. Prediabetes Carly [name]'s last  A1c on 11/14/2018 was 6.0.  She is taking metformin 500 mg daily.  I will refill this today.  - MetFORMIN (GLUCOPHAGE) 500 MG tablet; Take 1 tablet (500 mg total) by mouth daily with breakfast.  Diipense: 30 tablet; Refill: 0.  She agrees to follow-up with our clinic in 2 weeks.  2. Vitamin D deficiency Vitamin D level on 11/14/2018 was 43.3.  She is taking vitamin D 1000 IU daily.  No No nausea, vomiting, or muscle weakness.  Will continue to monitor.  3. Other hyperlipidemia Carly [name]'s triglycerides are elevated at 461 and her HDL is decreased at 35.  She is not currently on a statin.  4. Essential hypertension She is taking lisinopril-hydrochlorothiazide, and this is keeping her hypertension at goal.  Will continue to watch this.  5. B12 nutritional deficiency On 11/14/2018, Carly [name]'s B12 level was 159.  Will monitor.  6. At risk for diabetes mellitus Carly [name] is a 49 y.o. female and has risk factors for diabetes including obesity. We discussed intensive lifestyle modifications today with an emphasis on weight loss as well as increasing exercise and decreasing simple carbohydrates in her diet. (15 minutes)  7. Class 3 severe obesity with serious comorbidity and body mass index (BMI) of 40.0 to 44.9 in adult, unspecified obesity type [facility]) Carly [name] is currently in the action stage of change. As such, her goal is to continue with weight loss efforts. She has agreed to Category 4 Plan.   We discussed the following exercise goals  today: For substantial health benefits, adults should do at least 150 minutes (2 hours and 30 minutes) a week of moderate-intensity, or 75 minutes (1 hour and 15 minutes) a week of vigorous-intensity aerobic physical activity, or an equivalent combination of moderate- and vigorous-intensity aerobic activity. Aerobic activity should be performed in episodes of at least 10 minutes, and preferably, it should be spread throughout the week. Adults should also include  muscle-strengthening activities that involve all major muscle groups on 2 or more days a week.  I would like Carly [name] to walk 1/2 mile twice a week.  We discussed the following behavioral modification strategies today: increasing lean protein intake, decreasing simple carbohydrates, increasing vegetables and increasing water intake.  Carly [name] has agreed to follow-up with our clinic in 2 weeks. She was informed of the importance of frequent follow-up visits to maximize her success with intensive lifestyle modifications for her multiple health conditions.  Objective:   Blood pressure 119/77, pulse 97, temperature 97.8 F (36.6 C), temperature source Oral, height 5\' 4"  (1.626 m), weight 247 lb (112 kg), SpO2 97 %. Body mass index is 42.4 kg/m.  General: Cooperative, alert, well developed, in no acute distress. HEENT: Conjunctivae and lids unremarkable. Neck: No thyromegaly.  Cardiovascular: Regular rhythm.  Lungs: Normal work of breathing. Extremities: No edema.  Neurologic: No focal deficits.   Lab Results  Component Value Date   CREATININE 0.75 11/14/2018   BUN 19 11/14/2018   NA 137 11/14/2018   K 4.7 11/14/2018   CL 100 11/14/2018   CO2 21 11/14/2018   Lab Results  Component Value Date   ALT 16 11/14/2018   AST 21 11/14/2018   ALKPHOS 79 11/14/2018   BILITOT <0.2 11/14/2018   Lab Results  Component Value Date   HGBA1C 6.0 (H) 11/14/2018   Lab Results  Component Value Date   INSULIN 43.3 (H) 11/14/2018   Lab Results  Component Value Date   TSH 1.080 11/14/2018   Lab Results  Component Value Date   CHOL 199 11/14/2018   HDL 35 (L) 11/14/2018   LDLCALC 88 11/14/2018   TRIG 461 (H) 11/14/2018   Lab Results  Component Value Date   WBC 11.2 (H) 11/14/2018   HGB 13.2 11/14/2018   HCT 40.2 11/14/2018   MCV 87 11/14/2018   PLT 306 11/14/2018   Attestation Statements:   Reviewed by clinician on day of visit: allergies, medications, problem list, medical  history, surgical history, family history, social history and previous encounter notes.  This visit occurred during the SARS-CoV-2 public health emergency. Safety protocols were in place, including screening questions prior to the visit, additional usage of staff PPE, and extensive cleaning of exam room while observing appropriate contact time as indicated for disinfecting solutions. (CPT [id number])  I, Water quality scientist, am acting as Location manager for [employer], DO.  I have reviewed the above documentation for accuracy and completeness, and I agree with the above. Briscoe Deutscher, DO

## 2019-01-24 ENCOUNTER — Ambulatory Visit (INDEPENDENT_AMBULATORY_CARE_PROVIDER_SITE_OTHER): Payer: BC Managed Care – PPO | Admitting: Psychology

## 2019-01-24 ENCOUNTER — Other Ambulatory Visit: Payer: Self-pay

## 2019-01-24 DIAGNOSIS — F3289 Other specified depressive episodes: Secondary | ICD-10-CM | POA: Diagnosis not present

## 2019-01-26 ENCOUNTER — Encounter (INDEPENDENT_AMBULATORY_CARE_PROVIDER_SITE_OTHER): Payer: Self-pay | Admitting: Family Medicine

## 2019-01-26 MED ORDER — METFORMIN HCL 500 MG PO TABS: 500.0000 mg | ORAL_TABLET | Freq: Every day | ORAL | 0 refills | Status: DC

## 2019-01-31 NOTE — Progress Notes (Signed)
  Office: [phone]  /  Fax: [phone]    Date: February 14, 2019   Appointment Start Time: 10:02am Duration: 23 minutes Provider: Glennie Isle, Psy.D. Type of Session: Individual Therapy  Location of Patient: Work Biomedical scientist of Provider: [facility] Type of Contact: Telepsychological Visit via [employer]  Session Content: Carly [name] is a 49 y.o. female presenting via [place] for a follow-up appointment to address the previously established treatment goal of decreasing emotional eating. Today's appointment was a telepsychological visit due to COVID-19. Carly [name] provided verbal consent for today's telepsychological appointment and she is aware she is responsible for securing confidentiality on her end of the session. Prior to proceeding with today's appointment, Carly [name]'s physical location at the time of this appointment was obtained as well a phone number she could be reached at in the event of technical difficulties. Carly [name] and this provider participated in today's telepsychological service.   This provider conducted a brief check-in. Carly [name] shared she gained a pound, noting she had "another stomach issue." This was further explored as she described the aforementioned resulting in decreased confidence. She denied engagement in emotional eating, noting deviations occurred when she was physically hungry. She acknowledged she sometimes does not consume her snack calories; therefore, this provider and Carly [name] discussed the importance of consuming all allocated calories. Regarding mindfulness, Carly [name] reported engaging in shared exercises. She was led through a mindfulness exercise (A Taste of Mindfulness) during today's appointment. Her experience was processed. Carly [name] provided verbal consent during today's appointment for this provider to send the handout for today's exercise via e-mail. Overall, Carly [name] was receptive to today's appointment as evidenced by openness to sharing, responsiveness  to feedback, and willingness to engage in mindfulness exercises to assist with coping.  Mental Status Examination:  Appearance: well groomed and appropriate hygiene  Behavior: appropriate to circumstances Mood: euthymic Affect: mood congruent Speech: normal in rate, volume, and tone Eye Contact: appropriate Psychomotor Activity: appropriate Gait: unable to assess Thought Process: linear, logical, and goal directed  Thought Content/Perception: no hallucinations, delusions, bizarre thinking or behavior reported or observed and no evidence of suicidal and homicidal ideation, plan, and intent Orientation: time, person, place and purpose of appointment Memory/Concentration: memory, attention, language, and fund of knowledge intact  Insight/Judgment: good  Interventions:  Conducted a brief chart review Provided empathic reflections and validation Employed supportive psychotherapy interventions to facilitate reduced distress and to improve coping skills with identified stressors Employed motivational interviewing skills to assess patient's willingness/desire to adhere to recommended medical treatments and assignments Engaged patient in mindfulness exercise(s) Employed acceptance and commitment interventions to emphasize mindfulness and acceptance without struggle  DSM-5 Diagnosis: 311 (F32.8) Other Specified Depressive Disorder, Emotional Eating Behaviors  Treatment Goal & Progress: During the initial appointment with this provider, the following treatment goal was established: decrease emotional eating. Carly [name] has demonstrated progress in her goal as evidenced by increased awareness of hunger patterns, increased awareness of triggers for emotional eating and reduction in emotional eating. Carly [name] also demonstrates willingness to engage in mindfulness exercises.  Plan: The next appointment will be scheduled in one month, which will be via [employer]. The next session will focus on reviewing  learned skills and termination  .

## 2019-02-07 ENCOUNTER — Ambulatory Visit (INDEPENDENT_AMBULATORY_CARE_PROVIDER_SITE_OTHER): Payer: BC Managed Care – PPO | Admitting: Family Medicine

## 2019-02-07 ENCOUNTER — Encounter (INDEPENDENT_AMBULATORY_CARE_PROVIDER_SITE_OTHER): Payer: Self-pay | Admitting: Family Medicine

## 2019-02-07 ENCOUNTER — Other Ambulatory Visit: Payer: Self-pay

## 2019-02-07 VITALS — BP 126/76 | HR 83 | Temp 98.4°F | Ht 64.0 in | Wt 248.0 lb

## 2019-02-07 DIAGNOSIS — Z9189 Other specified personal risk factors, not elsewhere classified: Secondary | ICD-10-CM | POA: Diagnosis not present

## 2019-02-07 DIAGNOSIS — F3289 Other specified depressive episodes: Secondary | ICD-10-CM

## 2019-02-07 DIAGNOSIS — E7849 Other hyperlipidemia: Secondary | ICD-10-CM | POA: Diagnosis not present

## 2019-02-07 DIAGNOSIS — R102 Pelvic and perineal pain: Secondary | ICD-10-CM

## 2019-02-07 DIAGNOSIS — Z6841 Body Mass Index (BMI) 40.0 and over, adult: Secondary | ICD-10-CM

## 2019-02-07 DIAGNOSIS — R7303 Prediabetes: Secondary | ICD-10-CM | POA: Diagnosis not present

## 2019-02-07 DIAGNOSIS — E538 Deficiency of other specified B group vitamins: Secondary | ICD-10-CM

## 2019-02-07 DIAGNOSIS — E559 Vitamin D deficiency, unspecified: Secondary | ICD-10-CM

## 2019-02-07 MED ORDER — METFORMIN HCL 500 MG PO TABS: 500.0000 mg | ORAL_TABLET | Freq: Every day | ORAL | 0 refills | Status: DC

## 2019-02-07 NOTE — Telephone Encounter (Signed)
Please advise 

## 2019-02-07 NOTE — Progress Notes (Signed)
Chief Complaint:   OBESITY Carly [name] is here to discuss her progress with her obesity treatment plan along with follow-up of her obesity related diagnoses. Carly [name] is on the Category 4 Plan and states she is following her eating plan approximately 80% of the time. Carly [name] states she is walking for 20-25 minutes 2 times per week.  Today's visit was #: 6 Starting weight: 265 lbs Starting date: 11/14/2018 Today's weight: 248 lbs Today's date: 02/07/2019 Total lbs lost to date: 17 lbs Total lbs lost since last in-office visit: 0  Interim History: Carly [name] is doing better with walking.  She has increased her water intake since last visit.  Unfortunately, she had another flare of abdominal pain.  She is followed by GI but has been told that it could be endometriosis.    Subjective:   1. Other hyperlipidemia Carly [name] has hyperlipidemia and has been trying to improve her cholesterol levels with intensive lifestyle modification including a low saturated fat diet, exercise and weight loss. She denies any chest pain, claudication or myalgias.  She is not on a statin.  Lab Results  Component Value Date   ALT 16 11/14/2018   AST 21 11/14/2018   ALKPHOS 79 11/14/2018   BILITOT <0.2 11/14/2018   Lab Results  Component Value Date   CHOL 199 11/14/2018   HDL 35 (L) 11/14/2018   LDLCALC 88 11/14/2018   TRIG 461 (H) 11/14/2018   2. Prediabetes Carly [name] has a diagnosis of prediabetes based on her elevated HgA1c and was informed this puts her at greater risk of developing diabetes. She continues to work on diet and exercise to decrease her risk of diabetes. She denies nausea or hypoglycemia.  Currently taking metformin 500 mg daily.  Lab Results  Component Value Date   HGBA1C 6.0 (H) 11/14/2018   Lab Results  Component Value Date   INSULIN 43.3 (H) 11/14/2018   3. Vitamin D deficiency Carly [name]'s Vitamin D level was 43.3 on 11/14/2018. She is currently taking vit D. She denies nausea, vomiting or muscle  weakness.  4. Other depression, with emotional eating Carly [name] is struggling with emotional eating and using food for comfort to the extent that it is negatively impacting her health. She has been working on behavior modification techniques to help reduce her emotional eating and has been unsuccessful. She shows no sign of suicidal or homicidal ideations.  She met with Dr. Mallie Mussel on 01/24/2019.  Currently taking Lexapro 10 mg daily.  5. B12 nutritional deficiency She is not a vegetarian.  She does not have a previous diagnosis of pernicious anemia.  She does not have a history of weight loss surgery.   Lab Results  Component Value Date   VITAMINB12 159 (L) 11/14/2018   6. Pelvic pain Carly [name] was told she may possibly have endometriosis.  7. At risk for nausea Carly [name] is at increased risk for nausea.  Assessment/Plan:   1. Other hyperlipidemia Cardiovascular risk and specific lipid/LDL goals reviewed.  We discussed several lifestyle modifications today and Kameren will continue to work on diet, exercise and weight loss efforts. Orders and follow up as documented in patient record.   Counseling Intensive lifestyle modifications are the first line treatment for this issue. . Dietary changes: Increase soluble fiber. Decrease simple carbohydrates. . Exercise changes: Moderate to vigorous-intensity aerobic activity 150 minutes per week if tolerated. . Lipid-lowering medications: see documented in medical record.  2. Prediabetes Carly [name] will continue to work on weight loss, exercise, and decreasing simple carbohydrates to  help decrease the risk of diabetes.   Orders - metFORMIN (GLUCOPHAGE) 500 MG tablet; Take 1 tablet (500 mg total) by mouth daily with breakfast.  Dispense: 30 tablet; Refill: 0  3. Vitamin D deficiency Low Vitamin D level contributes to fatigue and are associated with obesity, breast, and colon cancer. She agrees to continue to take prescription Vitamin D @50 ,000 IU every week and  will follow-up for routine testing of Vitamin D, at least 2-3 times per year to avoid over-replacement.  4. Other depression, with emotional eating Behavior modification techniques were discussed today to help Carly [name] deal with her emotional/non-hunger eating behaviors.  Orders and follow up as documented in patient record.   5. B12 nutritional deficiency The diagnosis was reviewed with the patient. Counseling provided today, see below. We will continue to monitor. Orders and follow up as documented in patient record.  Will check labs at next visit.  Counseling . The body needs vitamin B12: to make red blood cells; to make DNA; and to help the nerves work properly so they can carry messages from the brain to the body.  . The main causes of vitamin B12 deficiency include dietary deficiency, digestive diseases, pernicious anemia, and having a surgery in which part of the stomach or small intestine is removed.  . Certain medicines can make it harder for the body to absorb vitamin B12. These medicines include: heartburn medications; some antibiotics; some medications used to treat diabetes, gout, and high cholesterol.  . In some cases, there are no symptoms of this condition. If the condition leads to anemia or nerve damage, various symptoms can occur, such as weakness or fatigue, shortness of breath, and numbness or tingling in your hands and feet.   . Treatment:  o May include taking vitamin B12 supplements.  o Avoid alcohol.  o Eat lots of healthy foods that contain vitamin B12: - Beef, pork, chicken, [place], and organ meats, such as liver.  - Seafood: This includes clams, rainbow trout, salmon, tuna, and haddock. Eggs.  - Cereal and dairy products that are fortified: This means that vitamin B12 has been added to the food.   6. Pelvic pain Will refer patient to Dr. Sumner Boast, GYN.  Orders - Ambulatory referral to Gynecology  7. At risk for nausea Carly [name] was given approximately  15 minutes of nausea prevention counseling today. Sheyli is at risk for nausea due to her new or current medication. She was encouraged to titrate her medication slowly, make sure to stay hydrated, eat smaller portions throughout the day, and avoid high fat meals.   8. Class 3 severe obesity with serious comorbidity and body mass index (BMI) of 40.0 to 44.9 in adult, unspecified obesity type [facility]) Carly [name] is currently in the action stage of change. As such, her goal is to continue with weight loss efforts. She has agreed to the Category 4 Plan.   Exercise goals: For substantial health benefits, adults should do at least 150 minutes (2 hours and 30 minutes) a week of moderate-intensity, or 75 minutes (1 hour and 15 minutes) a week of vigorous-intensity aerobic physical activity, or an equivalent combination of moderate- and vigorous-intensity aerobic activity. Aerobic activity should be performed in episodes of at least 10 minutes, and preferably, it should be spread throughout the week. Adults should also include muscle-strengthening activities that involve all major muscle groups on 2 or more days a week.  Behavioral modification strategies: planning for success.  Carly [name] has agreed to follow-up with our  clinic in 2 weeks. She was informed of the importance of frequent follow-up visits to maximize her success with intensive lifestyle modifications for her multiple health conditions.   Objective:   Blood pressure 126/76, pulse 83, temperature 98.4 F (36.9 C), temperature source Oral, height 5' 4"  (1.626 m), weight 248 lb (112.5 kg), last menstrual period 01/15/2019, SpO2 99 %. Body mass index is 42.57 kg/m.  General: Cooperative, alert, well developed, in no acute distress. HEENT: Conjunctivae and lids unremarkable. Cardiovascular: Regular rhythm.  Lungs: Normal work of breathing. Neurologic: No focal deficits.   Lab Results  Component Value Date   CREATININE 0.75 11/14/2018   BUN 19  11/14/2018   NA 137 11/14/2018   K 4.7 11/14/2018   CL 100 11/14/2018   CO2 21 11/14/2018   Lab Results  Component Value Date   ALT 16 11/14/2018   AST 21 11/14/2018   ALKPHOS 79 11/14/2018   BILITOT <0.2 11/14/2018   Lab Results  Component Value Date   HGBA1C 6.0 (H) 11/14/2018   Lab Results  Component Value Date   INSULIN 43.3 (H) 11/14/2018   Lab Results  Component Value Date   TSH 1.080 11/14/2018   Lab Results  Component Value Date   CHOL 199 11/14/2018   HDL 35 (L) 11/14/2018   LDLCALC 88 11/14/2018   TRIG 461 (H) 11/14/2018   Lab Results  Component Value Date   WBC 11.2 (H) 11/14/2018   HGB 13.2 11/14/2018   HCT 40.2 11/14/2018   MCV 87 11/14/2018   PLT 306 11/14/2018   Attestation Statements:   Reviewed by clinician on day of visit: allergies, medications, problem list, medical history, surgical history, family history, social history, and previous encounter notes.  I, Water quality scientist, CMA, am acting as Location manager for [employer], DO.  I have reviewed the above documentation for accuracy and completeness, and I agree with the above. Briscoe Deutscher, DO

## 2019-02-14 ENCOUNTER — Other Ambulatory Visit: Payer: Self-pay

## 2019-02-14 ENCOUNTER — Ambulatory Visit (INDEPENDENT_AMBULATORY_CARE_PROVIDER_SITE_OTHER): Payer: BC Managed Care – PPO | Admitting: Psychology

## 2019-02-14 DIAGNOSIS — F3289 Other specified depressive episodes: Secondary | ICD-10-CM

## 2019-02-21 ENCOUNTER — Other Ambulatory Visit: Payer: Self-pay

## 2019-02-21 ENCOUNTER — Encounter (INDEPENDENT_AMBULATORY_CARE_PROVIDER_SITE_OTHER): Payer: Self-pay | Admitting: Family Medicine

## 2019-02-21 ENCOUNTER — Ambulatory Visit (INDEPENDENT_AMBULATORY_CARE_PROVIDER_SITE_OTHER): Payer: BC Managed Care – PPO | Admitting: Family Medicine

## 2019-02-21 VITALS — BP 108/72 | HR 82 | Temp 98.5°F | Ht 64.0 in | Wt 243.0 lb

## 2019-02-21 DIAGNOSIS — I10 Essential (primary) hypertension: Secondary | ICD-10-CM | POA: Diagnosis not present

## 2019-02-21 DIAGNOSIS — Z6841 Body Mass Index (BMI) 40.0 and over, adult: Secondary | ICD-10-CM

## 2019-02-21 DIAGNOSIS — E7849 Other hyperlipidemia: Secondary | ICD-10-CM

## 2019-02-21 DIAGNOSIS — R7303 Prediabetes: Secondary | ICD-10-CM

## 2019-02-21 DIAGNOSIS — Z9189 Other specified personal risk factors, not elsewhere classified: Secondary | ICD-10-CM

## 2019-02-21 DIAGNOSIS — F3289 Other specified depressive episodes: Secondary | ICD-10-CM

## 2019-02-21 NOTE — Progress Notes (Signed)
Chief Complaint:   OBESITY Carly [name] is here to discuss her progress with her obesity treatment plan along with follow-up of her obesity related diagnoses. Carly [name] is on the Category 4 Plan and states she is following her eating plan approximately 90% of the time. Carly [name] states she is walking 1/2 mile 2 times per week.  Today's visit was #: 7 Starting weight: 265 lbs Starting date: 11/14/2018 Today's weight: 243 lbs Today's date: 02/21/2019 Total lbs lost to date: 22 lbs Total lbs lost since last in-office visit: 5 lbs  Interim History: Carly [name] is happy with her progress.  Subjective:   1. Prediabetes Carly [name] has a diagnosis of prediabetes based on her elevated HgA1c and was informed this puts her at greater risk of developing diabetes. She continues to work on diet and exercise to decrease her risk of diabetes. She denies nausea or hypoglycemia.  She is taking metformin 500 mg daily.  Lab Results  Component Value Date   HGBA1C 6.0 (H) 11/14/2018   Lab Results  Component Value Date   INSULIN 43.3 (H) 11/14/2018   2. Other hyperlipidemia Carly [name] has hyperlipidemia and has been trying to improve her cholesterol levels with intensive lifestyle modification including a low saturated fat diet, exercise and weight loss. She denies any chest pain, claudication or myalgias.  Lab Results  Component Value Date   ALT 16 11/14/2018   AST 21 11/14/2018   ALKPHOS 79 11/14/2018   BILITOT <0.2 11/14/2018   Lab Results  Component Value Date   CHOL 199 11/14/2018   HDL 35 (L) 11/14/2018   LDLCALC 88 11/14/2018   TRIG 461 (H) 11/14/2018   3. Essential hypertension Review: taking medications as instructed, no medication side effects noted, no chest pain on exertion, no dyspnea on exertion, no swelling of ankles.  Taking lisinopril-HCTZ for blood pressure control.  BP Readings from Last 3 Encounters:  02/21/19 108/72  02/07/19 126/76  01/22/19 119/77   4. Other Specified Depressive Disorder,  Emotional Eating Behaviors Carly [name] is struggling with emotional eating and using food for comfort to the extent that it is negatively impacting her health. She has been working on behavior modification techniques to help reduce her emotional eating and has been minimally successful. She shows no sign of suicidal or homicidal ideations.  5. At risk for diabetes mellitus Carly [name] is at higher than average risk for developing diabetes due to her obesity.   Assessment/Plan:   1. Prediabetes Carly [name] will continue to work on weight loss, exercise, and decreasing simple carbohydrates to help decrease the risk of diabetes.   Orders - Comprehensive metabolic panel - Hemoglobin A1c - VITAMIN D 25 Hydroxy (Vit-D Deficiency, Fractures) - Vitamin B12  2. Other hyperlipidemia Cardiovascular risk and specific lipid/LDL goals reviewed.  We discussed several lifestyle modifications today and Carly [name] will continue to work on diet, exercise and weight loss efforts. Orders and follow up as documented in patient record.   Counseling Intensive lifestyle modifications are the first line treatment for this issue. . Dietary changes: Increase soluble fiber. Decrease simple carbohydrates. . Exercise changes: Moderate to vigorous-intensity aerobic activity 150 minutes per week if tolerated. . Lipid-lowering medications: see documented in medical record.  Orders - Lipid panel  3. Essential hypertension Carly [name] is working on healthy weight loss and exercise to improve blood pressure control. We will watch for signs of hypotension as she continues her lifestyle modifications.  Orders - CBC with Differential/Platelet  4. Other Specified Depressive Disorder, Emotional Eating Behaviors Behavior  modification techniques were discussed today to help Carly [name] deal with her emotional/non-hunger eating behaviors.  Orders and follow up as documented in patient record.   5. At risk for diabetes mellitus Good blood sugar control is  important to decrease the likelihood of diabetic complications such as nephropathy, neuropathy, limb loss, blindness, coronary artery disease, and death. Intensive lifestyle modification including diet, exercise and weight loss are the first line of treatment for diabetes.   6. Class 3 severe obesity with serious comorbidity and body mass index (BMI) of 40.0 to 44.9 in adult, unspecified obesity type Carly [name]) [name] is currently in the action stage of change. As such, her goal is to continue with weight loss efforts. She has agreed to the Category 4 Plan.   Exercise goals: For substantial health benefits, adults should do at least 150 minutes (2 hours and 30 minutes) a week of moderate-intensity, or 75 minutes (1 hour and 15 minutes) a week of vigorous-intensity aerobic physical activity, or an equivalent combination of moderate- and vigorous-intensity aerobic activity. Aerobic activity should be performed in episodes of at least 10 minutes, and preferably, it should be spread throughout the week. Adults should also include muscle-strengthening activities that involve all major muscle groups on 2 or more days a week.  Behavioral modification strategies: increasing lean protein intake.  Carly [name] has agreed to follow-up with our clinic in 2 weeks. She was informed of the importance of frequent follow-up visits to maximize her success with intensive lifestyle modifications for her multiple health conditions.   Carly [name] was informed we would discuss her lab results at her next visit unless there is a critical issue that needs to be addressed sooner. Carly [name] agreed to keep her next visit at the agreed upon time to discuss these results.  Objective:   Blood pressure 108/72, pulse 82, temperature 98.5 F (36.9 C), temperature source Oral, height 5\' 4"  (1.626 m), weight 243 lb (110.2 kg), last menstrual period 01/29/2019, SpO2 97 %. Body mass index is 41.71 kg/m.  General: Cooperative, alert, well developed, in no  acute distress. HEENT: Conjunctivae and lids unremarkable. Cardiovascular: Regular rhythm.  Lungs: Normal work of breathing. Neurologic: No focal deficits.   Lab Results  Component Value Date   CREATININE 0.75 11/14/2018   BUN 19 11/14/2018   NA 137 11/14/2018   K 4.7 11/14/2018   CL 100 11/14/2018   CO2 21 11/14/2018   Lab Results  Component Value Date   ALT 16 11/14/2018   AST 21 11/14/2018   ALKPHOS 79 11/14/2018   BILITOT <0.2 11/14/2018   Lab Results  Component Value Date   HGBA1C 6.0 (H) 11/14/2018   Lab Results  Component Value Date   INSULIN 43.3 (H) 11/14/2018   Lab Results  Component Value Date   TSH 1.080 11/14/2018   Lab Results  Component Value Date   CHOL 199 11/14/2018   HDL 35 (L) 11/14/2018   LDLCALC 88 11/14/2018   TRIG 461 (H) 11/14/2018   Lab Results  Component Value Date   WBC 11.2 (H) 11/14/2018   HGB 13.2 11/14/2018   HCT 40.2 11/14/2018   MCV 87 11/14/2018   PLT 306 11/14/2018   Attestation Statements:   Reviewed by clinician on day of visit: allergies, medications, problem list, medical history, surgical history, family history, social history, and previous encounter notes.  I, Water quality scientist, CMA, am acting as Location manager for [employer], DO.  I have reviewed the above documentation for accuracy and completeness, and I agree with  the above. Briscoe Deutscher, DO

## 2019-02-22 LAB — CBC WITH DIFFERENTIAL/PLATELET
Basophils Absolute: 0.1 10*3/uL (ref 0.0–0.2)
Basos: 1 %
EOS (ABSOLUTE): 0.1 10*3/uL (ref 0.0–0.4)
Eos: 1 %
Hematocrit: 42.9 % (ref 34.0–46.6)
Hemoglobin: 14.1 g/dL (ref 11.1–15.9)
Immature Grans (Abs): 0 10*3/uL (ref 0.0–0.1)
Immature Granulocytes: 0 %
Lymphocytes Absolute: 1.6 10*3/uL (ref 0.7–3.1)
Lymphs: 20 %
MCH: 28.5 pg (ref 26.6–33.0)
MCHC: 32.9 g/dL (ref 31.5–35.7)
MCV: 87 fL (ref 79–97)
Monocytes Absolute: 0.5 10*3/uL (ref 0.1–0.9)
Monocytes: 7 %
Neutrophils Absolute: 5.9 10*3/uL (ref 1.4–7.0)
Neutrophils: 71 %
Platelets: 312 10*3/uL (ref 150–450)
RBC: 4.95 x10E6/uL (ref 3.77–5.28)
RDW: 13.5 % (ref 11.7–15.4)
WBC: 8.2 10*3/uL (ref 3.4–10.8)

## 2019-02-22 LAB — VITAMIN D 25 HYDROXY (VIT D DEFICIENCY, FRACTURES): Vit D, 25-Hydroxy: 58.6 ng/mL (ref 30.0–100.0)

## 2019-02-22 LAB — COMPREHENSIVE METABOLIC PANEL
ALT: 28 IU/L (ref 0–32)
AST: 20 IU/L (ref 0–40)
Albumin/Globulin Ratio: 1.6 (ref 1.2–2.2)
Albumin: 4.1 g/dL (ref 3.8–4.8)
Alkaline Phosphatase: 88 IU/L (ref 39–117)
BUN/Creatinine Ratio: 17 (ref 9–23)
BUN: 17 mg/dL (ref 6–24)
Bilirubin Total: 0.3 mg/dL (ref 0.0–1.2)
CO2: 24 mmol/L (ref 20–29)
Calcium: 9.9 mg/dL (ref 8.7–10.2)
Chloride: 101 mmol/L (ref 96–106)
Creatinine, Ser: 1.03 mg/dL — ABNORMAL HIGH (ref 0.57–1.00)
GFR calc Af Amer: 75 mL/min/{1.73_m2} (ref >59)
GFR calc non Af Amer: 65 mL/min/{1.73_m2} (ref >59)
Globulin, Total: 2.5 g/dL (ref 1.5–4.5)
Glucose: 85 mg/dL (ref 65–99)
Potassium: 4.5 mmol/L (ref 3.5–5.2)
Sodium: 140 mmol/L (ref 134–144)
Total Protein: 6.6 g/dL (ref 6.0–8.5)

## 2019-02-22 LAB — LIPID PANEL
Chol/HDL Ratio: 5.4 ratio — ABNORMAL HIGH (ref 0.0–4.4)
Cholesterol, Total: 200 mg/dL — ABNORMAL HIGH (ref 100–199)
HDL: 37 mg/dL — ABNORMAL LOW (ref >39)
LDL Chol Calc (NIH): 109 mg/dL — ABNORMAL HIGH (ref 0–99)
Triglycerides: 313 mg/dL — ABNORMAL HIGH (ref 0–149)
VLDL Cholesterol Cal: 54 mg/dL — ABNORMAL HIGH (ref 5–40)

## 2019-02-22 LAB — HEMOGLOBIN A1C
Est. average glucose Bld gHb Est-mCnc: 123 mg/dL
Hgb A1c MFr Bld: 5.9 % — ABNORMAL HIGH (ref 4.8–5.6)

## 2019-02-22 LAB — VITAMIN B12: Vitamin B-12: 317 pg/mL (ref 232–1245)

## 2019-02-28 NOTE — Progress Notes (Signed)
Office: [phone]  /  Fax: [phone]    Date: March 14, 2019   Appointment Start Time: 10:00am Duration: 20 minutes Provider: Glennie Isle, Psy.D. Type of Session: Individual Therapy  Location of Patient: Work Location of Provider: Provider's Home Type of Contact: Telepsychological Visit via [employer]  Session Content: Carly [name] is a 49 y.o. female presenting via [place] for a follow-up appointment to address the previously established treatment goal of decreasing emotional eating. Today's appointment was a telepsychological visit due to COVID-19. Carly [name] provided verbal consent for today's telepsychological appointment and she is aware she is responsible for securing confidentiality on her end of the session. Prior to proceeding with today's appointment, Carly [name]'s physical location at the time of this appointment was obtained as well a phone number she could be reached at in the event of technical difficulties. Carly [name] and this provider participated in today's telepsychological service.   This provider conducted a brief check-in and verbally administered the PHQ-9 and GAD-7. Progress to date was discussed and reflected, including reduction in PHQ-9 and GAD-7 scores. A plan was developed to help Carly [name] cope with emotional eating in the future using learned skills. She wrote down the following plan: focus on hydration, be prepared with snacks congruent to the meal plan, pause to ask questions when triggered to eat (e.g., Am I really hungry?; Is there something bothering me? Will I feel better if I eat?), and engage in coping skills after going through the aforementioned questions. Overall, Carly [name] shared about recent events, including weight loss. See was receptive to today's appointment as evidenced by openness to sharing, responsiveness to feedback, and willingness to continue engaging in learned skills.  Mental Status Examination:  Appearance: well groomed and appropriate hygiene    Behavior: appropriate to circumstances Mood: euthymic Affect: mood congruent Speech: normal in rate, volume, and tone Eye Contact: appropriate Psychomotor Activity: appropriate Gait: unable to assess Thought Process: linear, logical, and goal directed  Thought Content/Perception: no hallucinations, delusions, bizarre thinking or behavior reported or observed and no evidence of suicidal and homicidal ideation, plan, and intent Orientation: time, person, place and purpose of appointment Memory/Concentration: memory, attention, language, and fund of knowledge intact  Insight/Judgment: good  Structured Assessments Results: The Patient Health Questionnaire-9 (PHQ-9) is a self-report measure that assesses symptoms and severity of depression over the course of the last two weeks. Carly [name] obtained a score of 1 suggesting minimal depression. Carly [name] finds the endorsed symptoms to be somewhat difficult. [0= Not at all; 1= Several days; 2= More than half the days; 3= Nearly every day] [name] 0  Feeling down, depressed, or hopeless 0  Trouble falling or staying asleep, or sleeping too much 0  Feeling tired or having little energy 1  [name] 0  Feeling bad about yourself --- or that you are a failure or have let yourself or your family down 0  Trouble concentrating on things, such as reading the newspaper or watching television 0  Moving or speaking so slowly that other people could have noticed? Or the opposite --- being so fidgety or restless that you have been moving around a lot more than usual 0  Thoughts that you would be better off dead or hurting yourself in some way 0  PHQ-9 Score 1    The Generalized Anxiety Disorder-7 (GAD-7) is a brief self-report measure that assesses symptoms of anxiety over the course of the last two weeks. Carly [name] obtained a score of 0. [0=  Not at all; 1= Several days; 2= Over half the days; 3= Nearly every  day] Feeling nervous, anxious, on edge 0  Not being able to stop or control worrying 0  Worrying too much about different things 0  Trouble relaxing 0  Being so restless that it's hard to sit still 0  Becoming easily annoyed or irritable 0  Feeling afraid as if something awful might happen 0  GAD-7 Score 0   Interventions:  Conducted a brief chart review Verbally administered PHQ-9 and GAD-7 for symptom monitoring Provided empathic reflections and validation Provided positive reinforcement Employed supportive psychotherapy interventions to facilitate reduced distress and to improve coping skills with identified stressors Employed motivational interviewing skills to assess patient's willingness/desire to adhere to recommended medical treatments and assignments Reviewed learned skills  DSM-5 Diagnosis: 311 (F32.8) Other Specified Depressive Disorder, Emotional Eating Behaviors  Treatment Goal & Progress: During the initial appointment with this provider, the following treatment goal was established: decrease emotional eating. Carly [name] demonstrated progress in her goal as evidenced by increased awareness of hunger patterns, increased awareness of triggers for emotional eating and reduction in emotional eating. Carly [name] also continues to demonstrate willingness to engage in learned skill(s).  Plan: As previously planned, today was [facility] last appointment with this provider. She acknowledged understanding that she may request a follow-up appointment with this provider in the future as long as she is still established with the clinic. No further follow-up planned by this provider.

## 2019-03-04 ENCOUNTER — Other Ambulatory Visit: Payer: Self-pay | Admitting: Internal Medicine

## 2019-03-07 ENCOUNTER — Ambulatory Visit (INDEPENDENT_AMBULATORY_CARE_PROVIDER_SITE_OTHER): Payer: BC Managed Care – PPO | Admitting: Family Medicine

## 2019-03-07 ENCOUNTER — Other Ambulatory Visit: Payer: Self-pay

## 2019-03-07 ENCOUNTER — Encounter (INDEPENDENT_AMBULATORY_CARE_PROVIDER_SITE_OTHER): Payer: Self-pay | Admitting: Family Medicine

## 2019-03-07 VITALS — BP 112/70 | HR 75 | Temp 97.8°F | Ht 64.0 in | Wt 245.0 lb

## 2019-03-07 DIAGNOSIS — Z6841 Body Mass Index (BMI) 40.0 and over, adult: Secondary | ICD-10-CM

## 2019-03-07 DIAGNOSIS — R7303 Prediabetes: Secondary | ICD-10-CM

## 2019-03-07 DIAGNOSIS — E782 Mixed hyperlipidemia: Secondary | ICD-10-CM | POA: Diagnosis not present

## 2019-03-07 DIAGNOSIS — E538 Deficiency of other specified B group vitamins: Secondary | ICD-10-CM | POA: Diagnosis not present

## 2019-03-07 DIAGNOSIS — I10 Essential (primary) hypertension: Secondary | ICD-10-CM | POA: Diagnosis not present

## 2019-03-07 DIAGNOSIS — F3289 Other specified depressive episodes: Secondary | ICD-10-CM

## 2019-03-07 DIAGNOSIS — Z9189 Other specified personal risk factors, not elsewhere classified: Secondary | ICD-10-CM | POA: Diagnosis not present

## 2019-03-07 MED ORDER — METFORMIN HCL 1000 MG PO TABS: 1000.0000 mg | ORAL_TABLET | Freq: Every day | ORAL | 0 refills | Status: DC

## 2019-03-07 NOTE — Progress Notes (Signed)
Chief Complaint:   OBESITY Carly [name] is here to discuss her progress with her obesity treatment plan along with follow-up of her obesity related diagnoses. Carly [name] is on the Category 4 Plan and states she is following her eating plan approximately 90-95% of the time. Carly [name] states she is walking 1/2 mile for 10-12 minutes 2 times per week.  Today's visit was #: 8 Starting weight: 265 lbs Starting date: 11/14/2018 Today's weight: 245 lbs Today's date: 03/07/2019 Total lbs lost to date: 20 lbs Total lbs lost since last in-office visit: 0  Interim History: Carly [name] is frustrated by the lack of change but happy when body composition is reviewed.  Subjective:   1. Prediabetes Carly [name] has a diagnosis of prediabetes based on her elevated HgA1c and was informed this puts her at greater risk of developing diabetes. She continues to work on diet and exercise to decrease her risk of diabetes. She denies nausea or hypoglycemia.  She was not taking her metformin daily, but has started taking it daily recently.  Lab Results  Component Value Date   HGBA1C 5.9 (H) 02/21/2019   Lab Results  Component Value Date   INSULIN 43.3 (H) 11/14/2018   2. Mixed hyperlipidemia Carly [name] has hyperlipidemia and has been trying to improve her cholesterol levels with intensive lifestyle modification including a low saturated fat diet, exercise and weight loss. She denies any chest pain, claudication or myalgias.  Lab Results  Component Value Date   ALT 28 02/21/2019   AST 20 02/21/2019   ALKPHOS 88 02/21/2019   BILITOT 0.3 02/21/2019   Lab Results  Component Value Date   CHOL 200 (H) 02/21/2019   HDL 37 (L) 02/21/2019   LDLCALC 109 (H) 02/21/2019   TRIG 313 (H) 02/21/2019   CHOLHDL 5.4 (H) 02/21/2019   3. Essential hypertension Review: taking medications as instructed, no medication side effects noted, no chest pain on exertion, no dyspnea on exertion, no swelling of ankles.  Taking lisinopril-HCTZ for blood  pressure control.  BP Readings from Last 3 Encounters:  03/07/19 112/70  02/21/19 108/72  02/07/19 126/76   4. B12 nutritional deficiency She is not a vegetarian.  She does not have a previous diagnosis of pernicious anemia.  She does not have a history of weight loss surgery.  B12 level has improved but is still less than 400.   Lab Results  Component Value Date   VITAMINB12 317 02/21/2019   5. Other depression, with emotional eating Carly [name] is struggling with emotional eating and using food for comfort to the extent that it is negatively impacting her health. She has been working on behavior modification techniques to help reduce her emotional eating and has been unsuccessful. She shows no sign of suicidal or homicidal ideations.  She is taking Lexapro 10 mg daily.  6. At risk for heart disease [name] is at a higher than average risk for cardiovascular disease due to obesity. Reviewed: no chest pain on exertion, no dyspnea on exertion, and no swelling of ankles.  Assessment/Plan:   1. Prediabetes Carly [name] will continue to work on weight loss, exercise, and decreasing simple carbohydrates to help decrease the risk of diabetes.  Will increase metformin, as below.  Orders - metFORMIN (GLUCOPHAGE) 1000 MG tablet; Take 1 tablet (1,000 mg total) by mouth daily.  Dispense: 30 tablet; Refill: 0  2. Mixed hyperlipidemia Cardiovascular risk and specific lipid/LDL goals reviewed.  We discussed several lifestyle modifications today and Carly [name] will continue to work on diet, exercise  and weight loss efforts. Orders and follow up as documented in patient record.  Will recheck labs in 3 months.  Start statin if not improved.  Counseling Intensive lifestyle modifications are the first line treatment for this issue. . Dietary changes: Increase soluble fiber. Decrease simple carbohydrates. . Exercise changes: Moderate to vigorous-intensity aerobic activity 150 minutes per week if  tolerated. . Lipid-lowering medications: see documented in medical record.  3. Essential hypertension Carly [name] is working on healthy weight loss and exercise to improve blood pressure control. We will watch for signs of hypotension as she continues her lifestyle modifications.  4. B12 nutritional deficiency The diagnosis was reviewed with the patient. Counseling provided today, see below. We will continue to monitor. Orders and follow up as documented in patient record.  Recommend Carly [name] start a B complex vitamin.  Counseling . The body needs vitamin B12: to make red blood cells; to make DNA; and to help the nerves work properly so they can carry messages from the brain to the body.  . The main causes of vitamin B12 deficiency include dietary deficiency, digestive diseases, pernicious anemia, and having a surgery in which part of the stomach or small intestine is removed.  . Certain medicines can make it harder for the body to absorb vitamin B12. These medicines include: heartburn medications; some antibiotics; some medications used to treat diabetes, gout, and high cholesterol.  . In some cases, there are no symptoms of this condition. If the condition leads to anemia or nerve damage, various symptoms can occur, such as weakness or fatigue, shortness of breath, and numbness or tingling in your hands and feet.   . Treatment:  o May include taking vitamin B12 supplements.  o Avoid alcohol.  o Eat lots of healthy foods that contain vitamin B12: - Beef, pork, chicken, [place], and organ meats, such as liver.  - Seafood: This includes clams, rainbow trout, salmon, tuna, and haddock. Eggs.  - Cereal and dairy products that are fortified: This means that vitamin B12 has been added to the food.   5. Other depression, with emotional eating Behavior modification techniques were discussed today to help Carly [name] deal with her emotional/non-hunger eating behaviors.  Orders and follow up as documented in patient  record.   6. At risk for heart disease Carly [name] was given approximately 15 minutes of coronary artery disease prevention counseling today. She is 49 y.o. female and has risk factors for heart disease including obesity. We discussed intensive lifestyle modifications today with an emphasis on specific weight loss instructions and strategies.   Repetitive spaced learning was employed today to elicit superior memory formation and behavioral change.  7. Class 3 severe obesity with serious comorbidity and body mass index (BMI) of 40.0 to 44.9 in adult, unspecified obesity type [facility]) Carly [name] is currently in the action stage of change. As such, her goal is to continue with weight loss efforts. She has agreed to the Category 4 Plan.   Exercise goals: For substantial health benefits, adults should do at least 150 minutes (2 hours and 30 minutes) a week of moderate-intensity, or 75 minutes (1 hour and 15 minutes) a week of vigorous-intensity aerobic physical activity, or an equivalent combination of moderate- and vigorous-intensity aerobic activity. Aerobic activity should be performed in episodes of at least 10 minutes, and preferably, it should be spread throughout the week.  Behavioral modification strategies: increasing lean protein intake and increasing water intake.  Trejure has agreed to follow-up with our clinic in 2 weeks. She was  informed of the importance of frequent follow-up visits to maximize her success with intensive lifestyle modifications for her multiple health conditions.   Objective:   Blood pressure 112/70, pulse 75, temperature 97.8 F (36.6 C), temperature source Oral, height 5\' 4"  (1.626 m), weight 245 lb (111.1 kg), SpO2 99 %. Body mass index is 42.05 kg/m.  General: Cooperative, alert, well developed, in no acute distress. HEENT: Conjunctivae and lids unremarkable. Cardiovascular: Regular rhythm.  Lungs: Normal work of breathing. Neurologic: No focal deficits.   Lab Results   Component Value Date   CREATININE 1.03 (H) 02/21/2019   BUN 17 02/21/2019   NA 140 02/21/2019   K 4.5 02/21/2019   CL 101 02/21/2019   CO2 24 02/21/2019   Lab Results  Component Value Date   ALT 28 02/21/2019   AST 20 02/21/2019   ALKPHOS 88 02/21/2019   BILITOT 0.3 02/21/2019   Lab Results  Component Value Date   HGBA1C 5.9 (H) 02/21/2019   HGBA1C 6.0 (H) 11/14/2018   Lab Results  Component Value Date   INSULIN 43.3 (H) 11/14/2018   Lab Results  Component Value Date   TSH 1.080 11/14/2018   Lab Results  Component Value Date   CHOL 200 (H) 02/21/2019   HDL 37 (L) 02/21/2019   LDLCALC 109 (H) 02/21/2019   TRIG 313 (H) 02/21/2019   CHOLHDL 5.4 (H) 02/21/2019   Lab Results  Component Value Date   WBC 8.2 02/21/2019   HGB 14.1 02/21/2019   HCT 42.9 02/21/2019   MCV 87 02/21/2019   PLT 312 02/21/2019   Attestation Statements:   Reviewed by clinician on day of visit: allergies, medications, problem list, medical history, surgical history, family history, social history, and previous encounter notes.  I, Water quality scientist, CMA, am acting as Location manager for [employer], DO.  I have reviewed the above documentation for accuracy and completeness, and I agree with the above. Briscoe Deutscher, DO

## 2019-03-14 ENCOUNTER — Ambulatory Visit (INDEPENDENT_AMBULATORY_CARE_PROVIDER_SITE_OTHER): Payer: BC Managed Care – PPO | Admitting: Psychology

## 2019-03-14 ENCOUNTER — Ambulatory Visit: Payer: Self-pay | Admitting: Obstetrics and Gynecology

## 2019-03-14 ENCOUNTER — Other Ambulatory Visit: Payer: Self-pay

## 2019-03-14 DIAGNOSIS — F3289 Other specified depressive episodes: Secondary | ICD-10-CM | POA: Diagnosis not present

## 2019-03-14 DIAGNOSIS — F5089 Other specified eating disorder: Secondary | ICD-10-CM

## 2019-03-21 ENCOUNTER — Encounter: Payer: Self-pay | Admitting: Obstetrics and Gynecology

## 2019-03-21 ENCOUNTER — Ambulatory Visit (INDEPENDENT_AMBULATORY_CARE_PROVIDER_SITE_OTHER): Payer: BC Managed Care – PPO | Admitting: Family Medicine

## 2019-03-21 ENCOUNTER — Ambulatory Visit: Payer: BC Managed Care – PPO | Admitting: Obstetrics and Gynecology

## 2019-03-21 ENCOUNTER — Encounter (INDEPENDENT_AMBULATORY_CARE_PROVIDER_SITE_OTHER): Payer: Self-pay | Admitting: Family Medicine

## 2019-03-21 ENCOUNTER — Other Ambulatory Visit: Payer: Self-pay

## 2019-03-21 VITALS — BP 125/76 | HR 90 | Temp 97.9°F | Ht 64.0 in | Wt 246.0 lb

## 2019-03-21 VITALS — BP 130/74 | HR 92 | Temp 99.8°F | Ht 64.0 in | Wt 243.0 lb

## 2019-03-21 DIAGNOSIS — Z01419 Encounter for gynecological examination (general) (routine) without abnormal findings: Secondary | ICD-10-CM | POA: Diagnosis not present

## 2019-03-21 DIAGNOSIS — F3289 Other specified depressive episodes: Secondary | ICD-10-CM

## 2019-03-21 DIAGNOSIS — Z9189 Other specified personal risk factors, not elsewhere classified: Secondary | ICD-10-CM

## 2019-03-21 DIAGNOSIS — R1013 Epigastric pain: Secondary | ICD-10-CM

## 2019-03-21 DIAGNOSIS — Z6841 Body Mass Index (BMI) 40.0 and over, adult: Secondary | ICD-10-CM

## 2019-03-21 DIAGNOSIS — I10 Essential (primary) hypertension: Secondary | ICD-10-CM

## 2019-03-21 DIAGNOSIS — R7303 Prediabetes: Secondary | ICD-10-CM

## 2019-03-21 DIAGNOSIS — E782 Mixed hyperlipidemia: Secondary | ICD-10-CM | POA: Diagnosis not present

## 2019-03-21 DIAGNOSIS — Z9989 Dependence on other enabling machines and devices: Secondary | ICD-10-CM

## 2019-03-21 DIAGNOSIS — G4733 Obstructive sleep apnea (adult) (pediatric): Secondary | ICD-10-CM

## 2019-03-21 DIAGNOSIS — Z124 Encounter for screening for malignant neoplasm of cervix: Secondary | ICD-10-CM

## 2019-03-21 HISTORY — DX: Prediabetes: R73.03

## 2019-03-21 MED ORDER — NORETHINDRONE 0.35 MG PO TABS: 1.0000 | ORAL_TABLET | Freq: Every day | ORAL | 3 refills | Status: DC

## 2019-03-21 NOTE — Progress Notes (Signed)
Chief Complaint:   OBESITY Carly [name] is here to discuss her progress with her obesity treatment plan along with follow-up of her obesity related diagnoses. Carly [name] is on the Category 4 Plan and states she is following her eating plan approximately 90% of the time. Carly [name] states she is walking 1/2 mile 3-4 times per week.  Today's visit was #: 9 Starting weight: 265 lbs Starting date: 11/14/2018 Today's weight: 246 lbs Today's date: 03/21/2019 Total lbs lost to date: 19 lbs Total lbs lost since last in-office visit: 0  Interim History: Carly [name] provided the following food recall today:  Breakfast:  Special K, berries, milk. Lunch:  4-6 ounces of meat, vegetables, or frozen meal. Dinner:  8-10 ounces meat, vegetables.   Subjective:   1. Prediabetes Carly [name] has a diagnosis of prediabetes based on her elevated HgA1c and was informed this puts her at greater risk of developing diabetes. She continues to work on diet and exercise to decrease her risk of diabetes. She denies nausea or hypoglycemia.  She takes metformin 1000 mg daily.  Lab Results  Component Value Date   HGBA1C 5.9 (H) 02/21/2019   Lab Results  Component Value Date   INSULIN 43.3 (H) 11/14/2018   2. Mixed hyperlipidemia Carly [name] has hyperlipidemia and has been trying to improve her cholesterol levels with intensive lifestyle modification including a low saturated fat diet, exercise and weight loss. She denies any chest pain, claudication or myalgias.  Lab Results  Component Value Date   ALT 28 02/21/2019   AST 20 02/21/2019   ALKPHOS 88 02/21/2019   BILITOT 0.3 02/21/2019   Lab Results  Component Value Date   CHOL 200 (H) 02/21/2019   HDL 37 (L) 02/21/2019   LDLCALC 109 (H) 02/21/2019   TRIG 313 (H) 02/21/2019   CHOLHDL 5.4 (H) 02/21/2019   3. Essential hypertension Review: taking medications as instructed, no medication side effects noted, no chest pain on exertion, no dyspnea on exertion, no swelling of ankles.   Carly [name] is taking lisonopril-HCTZ.  BP Readings from Last 3 Encounters:  03/21/19 125/76  03/21/19 130/74  03/07/19 112/70   4. Other depression, with emotional eating Carly [name] is struggling with emotional eating and using food for comfort to the extent that it is negatively impacting her health. She has been working on behavior modification techniques to help reduce her emotional eating and has been unsuccessful. She shows no sign of suicidal or homicidal ideations.  She is taking Lexapro.  Assessment/Plan:   1. Prediabetes Carly [name] will continue to work on weight loss, exercise, and decreasing simple carbohydrates to help decrease the risk of diabetes.   2. Mixed hyperlipidemia Cardiovascular risk and specific lipid/LDL goals reviewed.  We discussed several lifestyle modifications today and Carly [name] will continue to work on diet, exercise and weight loss efforts. Orders and follow up as documented in patient record.   Counseling Intensive lifestyle modifications are the first line treatment for this issue. . Dietary changes: Increase soluble fiber. Decrease simple carbohydrates. . Exercise changes: Moderate to vigorous-intensity aerobic activity 150 minutes per week if tolerated. . Lipid-lowering medications: see documented in medical record.  3. Essential hypertension Carly [name] is working on healthy weight loss and exercise to improve blood pressure control. We will watch for signs of hypotension as she continues her lifestyle modifications.  4. Other depression, with emotional eating Behavior modification techniques were discussed today to help Carly [name] deal with her emotional/non-hunger eating behaviors.  Orders and follow up as documented in patient record.  5. At risk for heart disease Carly [name] was given approximately 15 minutes of coronary artery disease prevention counseling today. She is 49 y.o. female and has risk factors for heart disease including obesity. We discussed intensive lifestyle  modifications today with an emphasis on specific weight loss instructions and strategies.   Repetitive spaced learning was employed today to elicit superior memory formation and behavioral change.  6. Class 3 severe obesity with serious comorbidity and body mass index (BMI) of 40.0 to 44.9 in adult, unspecified obesity type [facility]) Carly [name] is currently in the action stage of change. As such, her goal is to continue with weight loss efforts. She has agreed to keeping a food journal and adhering to recommended goals of 2000 calories and 95+ grams of protein.   Exercise goals: As is.  Behavioral modification strategies: increasing lean protein intake and increasing water intake.  Carly [name] has agreed to follow-up with our clinic in 2 weeks. She was informed of the importance of frequent follow-up visits to maximize her success with intensive lifestyle modifications for her multiple health conditions.   Objective:   Blood pressure 125/76, pulse 90, temperature 97.9 F (36.6 C), temperature source Oral, height 5\' 4"  (1.626 m), weight 246 lb (111.6 kg), last menstrual period 03/13/2019, SpO2 97 %. Body mass index is 42.23 kg/m.  General: Cooperative, alert, well developed, in no acute distress. HEENT: Conjunctivae and lids unremarkable. Cardiovascular: Regular rhythm.  Lungs: Normal work of breathing. Neurologic: No focal deficits.   Lab Results  Component Value Date   CREATININE 1.03 (H) 02/21/2019   BUN 17 02/21/2019   NA 140 02/21/2019   K 4.5 02/21/2019   CL 101 02/21/2019   CO2 24 02/21/2019   Lab Results  Component Value Date   ALT 28 02/21/2019   AST 20 02/21/2019   ALKPHOS 88 02/21/2019   BILITOT 0.3 02/21/2019   Lab Results  Component Value Date   HGBA1C 5.9 (H) 02/21/2019   HGBA1C 6.0 (H) 11/14/2018   Lab Results  Component Value Date   INSULIN 43.3 (H) 11/14/2018   Lab Results  Component Value Date   TSH 1.080 11/14/2018   Lab Results  Component Value Date   CHOL  200 (H) 02/21/2019   HDL 37 (L) 02/21/2019   LDLCALC 109 (H) 02/21/2019   TRIG 313 (H) 02/21/2019   CHOLHDL 5.4 (H) 02/21/2019   Lab Results  Component Value Date   WBC 8.2 02/21/2019   HGB 14.1 02/21/2019   HCT 42.9 02/21/2019   MCV 87 02/21/2019   PLT 312 02/21/2019   Attestation Statements:   Reviewed by clinician on day of visit: allergies, medications, problem list, medical history, surgical history, family history, social history, and previous encounter notes.  I, Water quality scientist, CMA, am acting as Location manager for [employer], DO.  I have reviewed the above documentation for accuracy and completeness, and I agree with the above. Briscoe Deutscher, DO

## 2019-03-21 NOTE — Patient Instructions (Addendum)
EXERCISE AND DIET:  We recommended that you start or continue a regular exercise program for good health. Regular exercise means any activity that makes your heart beat faster and makes you sweat.  We recommend exercising at least 30 minutes per day at least 3 days a week, preferably 4 or 5.  We also recommend a diet low in fat and sugar.  Inactivity, poor dietary choices and obesity can cause diabetes, heart attack, stroke, and kidney damage, among others.    ALCOHOL AND SMOKING:  Women should limit their alcohol intake to no more than 7 drinks/beers/glasses of wine (combined, not each!) per week. Moderation of alcohol intake to this level decreases your risk of breast cancer and liver damage. And of course, no recreational drugs are part of a healthy lifestyle.  And absolutely no smoking or even second hand smoke. Most people know smoking can cause heart and lung diseases, but did you know it also contributes to weakening of your bones? Aging of your skin?  Yellowing of your teeth and nails?  CALCIUM AND VITAMIN D:  Adequate intake of calcium and Vitamin D are recommended.  The recommendations for exact amounts of these supplements seem to change often, but generally speaking 1,000 mg of calcium (between diet and supplement) and 800 units of Vitamin D per day seems prudent. Certain women may benefit from higher intake of Vitamin D.  If you are among these women, your doctor will have told you during your visit.    PAP SMEARS:  Pap smears, to check for cervical cancer or precancers,  have traditionally been done yearly, although recent scientific advances have shown that most women can have pap smears less often.  However, every woman still should have a physical exam from her gynecologist every year. It will include a breast check, inspection of the vulva and vagina to check for abnormal growths or skin changes, a visual exam of the cervix, and then an exam to evaluate the size and shape of the uterus and  ovaries.  And after 48 years of age, a rectal exam is indicated to check for rectal cancers. We will also provide age appropriate advice regarding health maintenance, like when you should have certain vaccines, screening for sexually transmitted diseases, bone density testing, colonoscopy, mammograms, etc.   MAMMOGRAMS:  All women over 40 years old should have a yearly mammogram. Many facilities now offer a "3D" mammogram, which may cost around $50 extra out of pocket. If possible,  we recommend you accept the option to have the 3D mammogram performed.  It both reduces the number of women who will be called back for extra views which then turn out to be normal, and it is better than the routine mammogram at detecting truly abnormal areas.    COLON CANCER SCREENING: Now recommend starting at age 45. At this time colonoscopy is not covered for routine screening until 50. There are take home tests that can be done between 45-49.   COLONOSCOPY:  Colonoscopy to screen for colon cancer is recommended for all women at age 50.  We know, you hate the idea of the prep.  We agree, BUT, having colon cancer and not knowing it is worse!!  Colon cancer so often starts as a polyp that can be seen and removed at colonscopy, which can quite literally save your life!  And if your first colonoscopy is normal and you have no family history of colon cancer, most women don't have to have it again for   10 years.  Once every ten years, you can do something that may end up saving your life, right?  We will be happy to help you get it scheduled when you are ready.  Be sure to check your insurance coverage so you understand how much it will cost.  It may be covered as a preventative service at no cost, but you should check your particular policy.      Breast Self-Awareness Breast self-awareness means being familiar with how your breasts look and feel. It involves checking your breasts regularly and reporting any changes to your  health care provider. Practicing breast self-awareness is important. A change in your breasts can be a sign of a serious medical problem. Being familiar with how your breasts look and feel allows you to find any problems early, when treatment is more likely to be successful. All women should practice breast self-awareness, including women who have had breast implants. How to do a breast self-exam One way to learn what is normal for your breasts and whether your breasts are changing is to do a breast self-exam. To do a breast self-exam: Look for Changes  1. Remove all the clothing above your waist. 2. Stand in front of a mirror in a room with good lighting. 3. Put your hands on your hips. 4. Push your hands firmly downward. 5. Compare your breasts in the mirror. Look for differences between them (asymmetry), such as: ? Differences in shape. ? Differences in size. ? Puckers, dips, and bumps in one breast and not the other. 6. Look at each breast for changes in your skin, such as: ? Redness. ? Scaly areas. 7. Look for changes in your nipples, such as: ? Discharge. ? Bleeding. ? Dimpling. ? Redness. ? A change in position. Feel for Changes Carefully feel your breasts for lumps and changes. It is best to do this while lying on your back on the floor and again while sitting or standing in the shower or tub with soapy water on your skin. Feel each breast in the following way:  Place the arm on the side of the breast you are examining above your head.  Feel your breast with the other hand.  Start in the nipple area and make  inch (2 cm) overlapping circles to feel your breast. Use the pads of your three middle fingers to do this. Apply light pressure, then medium pressure, then firm pressure. The light pressure will allow you to feel the tissue closest to the skin. The medium pressure will allow you to feel the tissue that is a little deeper. The firm pressure will allow you to feel the tissue  close to the ribs.  Continue the overlapping circles, moving downward over the breast until you feel your ribs below your breast.  Move one finger-width toward the center of the body. Continue to use the  inch (2 cm) overlapping circles to feel your breast as you move slowly up toward your collarbone.  Continue the up and down exam using all three pressures until you reach your armpit.  Write Down What You Find  Write down what is normal for each breast and any changes that you find. Keep a written record with breast changes or normal findings for each breast. By writing this information down, you do not need to depend only on memory for size, tenderness, or location. Write down where you are in your menstrual cycle, if you are still menstruating. If you are having trouble noticing differences   in your breasts, do not get discouraged. With time you will become more familiar with the variations in your breasts and more comfortable with the exam. How often should I examine my breasts? Examine your breasts every month. If you are breastfeeding, the best time to examine your breasts is after a feeding or after using a breast pump. If you menstruate, the best time to examine your breasts is 5-7 days after your period is over. During your period, your breasts are lumpier, and it may be more difficult to notice changes. When should I see my health care provider? See your health care provider if you notice:  A change in shape or size of your breasts or nipples.  A change in the skin of your breast or nipples, such as a reddened or scaly area.  Unusual discharge from your nipples.  A lump or thick area that was not there before.  Pain in your breasts.  Anything that concerns you.  Kegel Exercises  Kegel exercises can help strengthen your pelvic floor muscles. The pelvic floor is a group of muscles that support your rectum, small intestine, and bladder. In females, pelvic floor muscles also help  support the womb (uterus). These muscles help you control the flow of urine and stool. Kegel exercises are painless and simple, and they do not require any equipment. Your provider may suggest Kegel exercises to:  Improve bladder and bowel control.  Improve sexual response.  Improve weak pelvic floor muscles after surgery to remove the uterus (hysterectomy) or pregnancy (females).  Improve weak pelvic floor muscles after prostate gland removal or surgery (males). Kegel exercises involve squeezing your pelvic floor muscles, which are the same muscles you squeeze when you try to stop the flow of urine or keep from passing gas. The exercises can be done while sitting, standing, or lying down, but it is best to vary your position. Exercises How to do Kegel exercises: 1. Squeeze your pelvic floor muscles tight. You should feel a tight lift in your rectal area. If you are a female, you should also feel a tightness in your vaginal area. Keep your stomach, buttocks, and legs relaxed. 2. Hold the muscles tight for up to 10 seconds. 3. Breathe normally. 4. Relax your muscles. 5. Repeat as told by your health care provider. Repeat this exercise daily as told by your health care provider. Continue to do this exercise for at least 4-6 weeks, or for as long as told by your health care provider. You may be referred to a physical therapist who can help you learn more about how to do Kegel exercises. Depending on your condition, your health care provider may recommend:  Varying how long you squeeze your muscles.  Doing several sets of exercises every day.  Doing exercises for several weeks.  Making Kegel exercises a part of your regular exercise routine. This information is not intended to replace advice given to you by your health care provider. Make sure you discuss any questions you have with your health care provider. Document Revised: 08/24/2017 Document Reviewed: 08/24/2017 Elsevier Patient  Education  [employer].

## 2019-03-21 NOTE — Progress Notes (Signed)
49 y.o. G3P0 Married White or Caucasian Not Hispanic or Latino female here for annual exam.  Patient states that her gastro doctor told her she could have endometriosis. She was having some nausea and abdominal pain every month. She is better with Elavil. Symptoms started ~2 years ago. She has intermittent epigastric, upper abdominal pain, nausea, emesis and diarrhea. Symptoms last x 2-3 days. Also starts around the 20 th of the month. Can occur during active or placebo pills. At the moment it is controlled with medication.   She is on OCP's Period Cycle (Days): 28 Period Duration (Days): 2-3 Period Pattern: Regular Menstrual Flow: Light Menstrual Control: Maxi pad Menstrual Control Change Freq (Hours): 3-4 Dysmenorrhea: None  Patient's last menstrual period was 03/13/2019 (approximate).          Sexually active: Yes.    The current method of family planning is OCP (estrogen/progesterone).    Exercising: Yes.    walking 3-4 times a week  Smoker:  no  Health Maintenance: Pap:  About 2 years ago  History of abnormal Pap:  Yes HPV, 4-5 years ago, she had a leep MMG:  Yes normal per patient, over a year ago. BMD:   No  Colonoscopy:04/19/16 normal repeat in 5 years   TDaP:  12/12/2013 Gardasil: none   reports that she has never smoked. She has never used smokeless tobacco. She reports that she does not drink alcohol or use drugs. Son is 24, daughter is 29. 2 grandchildren. She is a Environmental consultant at [employer].   Past Medical History:  Diagnosis Date  . Back pain   . Depression   . Diverticulitis   . Gastritis   . GERD (gastroesophageal reflux disease)   . Hepatic steatosis   . Hypertension   . Internal hemorrhoids   . Sleep apnea   . Tubular adenoma of colon   . Vitamin D deficiency     Past Surgical History:  Procedure Laterality Date  . CHOLECYSTECTOMY    . MICROLARYNGOSCOPY WITH CO2 LASER AND EXCISION OF VOCAL CORD LESION    . SHOULDER ARTHROSCOPY      Current Outpatient  Medications  Medication Sig Dispense Refill  . amitriptyline (ELAVIL) 25 MG tablet Take 2 at bedtime 180 tablet 1  . b complex vitamins tablet Take 1 tablet by mouth daily.    Marland Kitchen escitalopram (LEXAPRO) 10 MG tablet Take 10 mg by mouth daily.    Marland Kitchen esomeprazole (NEXIUM) 40 MG capsule TAKE 1 CAPSULE (40 MG TOTAL) BY MOUTH DAILY. 30 MINUTES BEFORE BREAKFAST 90 capsule 0  . hyoscyamine (LEVSIN SL) 0.125 MG SL tablet 1-2 tablets under the tongue ever 6 - 8 hours as needed 30 tablet 2  . lisinopril-hydrochlorothiazide (PRINZIDE,ZESTORETIC) 20-25 MG tablet Take 1 tablet by mouth daily.    . meclizine (ANTIVERT) 25 MG tablet Take 1 tablet by mouth as needed.    . metFORMIN (GLUCOPHAGE) 1000 MG tablet Take 1 tablet (1,000 mg total) by mouth daily. 30 tablet 0  . norethindrone-ethinyl estradiol (CYCLAFEM) 0.5/0.75/1-35 MG-MCG tablet Take 1 tablet by mouth daily.    Marland Kitchen VITAMIN D, CHOLECALCIFEROL, PO Take 1,000 Int'l Units/day by mouth daily.     Current Facility-Administered Medications  Medication Dose Route Frequency Provider Last Rate Last Admin  . 0.9 %  sodium chloride infusion  500 mL Intravenous Once Pyrtle, Lajuan Lines, MD        Family History  Problem Relation Age of Onset  . Diabetes Father   . Lung  cancer Father   . Bladder Cancer Father   . Heart disease Father   . Hypertension Father   . Hyperlipidemia Father   . Stroke Father   . Obesity Father   . Depression Mother   . Diabetes Maternal Grandmother   . Diabetes Paternal Grandmother   . Colon cancer Paternal Aunt 68  . Esophageal cancer Neg Hx   . Rectal cancer Neg Hx   . Stomach cancer Neg Hx     Review of Systems  All other systems reviewed and are negative.   Exam:   BP 130/74   Pulse 92   Temp 99.8 F (37.7 C)   Ht 5\' 4"  (1.626 m)   Wt 243 lb (110.2 kg)   LMP 03/13/2019 (Approximate)   SpO2 97%   BMI 41.71 kg/m   Weight change: @WEIGHTCHANGE @ Height:   Height: 5\' 4"  (162.6 cm)  Ht Readings from Last 3 Encounters:   03/21/19 5\' 4"  (1.626 m)  03/07/19 5\' 4"  (1.626 m)  02/21/19 5\' 4"  (1.626 m)    General appearance: alert, cooperative and appears stated age Head: Normocephalic, without obvious abnormality, atraumatic Neck: no adenopathy, supple, symmetrical, trachea midline and thyroid normal to inspection and palpation Lungs: clear to auscultation bilaterally Cardiovascular: regular rate and rhythm Breasts: normal appearance, no masses or tenderness Abdomen: soft, non-tender; non distended,  no masses,  no organomegaly Extremities: extremities normal, atraumatic, no cyanosis or edema Skin: Skin color, texture, turgor normal. No rashes or lesions Lymph nodes: Cervical, supraclavicular, and axillary nodes normal. No abnormal inguinal nodes palpated Neurologic: Grossly normal   Pelvic: External genitalia:  no lesions              Urethra:  normal appearing urethra with no masses, tenderness or lesions              Bartholins and Skenes: normal                 Vagina: normal appearing vagina with normal color and discharge, no lesions              Cervix: no lesions               Bimanual Exam:  Uterus:  no masses or tenderness              Adnexa: no mass, fullness, tenderness               Rectovaginal: Confirms               Anus:  normal sphincter tone, no lesions  Karmen Bongo chaperoned for the exam.  A:  Well Woman with normal exam  Contraception management, recommended she go off of OCP's. Discussed options for contraception  H/O leep, ~2010  Prediabetes, on metformin  H/O HTN  H/O cyclic epigastric abdominal pain and nausea, improved on Elavil. I doubt that it is endometriosis.  P:   Change to the minipill  Pap with hpv  Labs are UTD with Dr Juleen China  Mammogram due, she will schedule  Discussed breast self exam  Discussed calcium and vit D intake

## 2019-03-22 ENCOUNTER — Other Ambulatory Visit (HOSPITAL_COMMUNITY)
Admission: RE | Admit: 2019-03-22 | Discharge: 2019-03-22 | Disposition: A | Discharge status: Home / Self Care | Payer: BC Managed Care – PPO | Source: Ambulatory Visit | Attending: Obstetrics and Gynecology | Admitting: Obstetrics and Gynecology

## 2019-03-22 DIAGNOSIS — Z124 Encounter for screening for malignant neoplasm of cervix: Secondary | ICD-10-CM | POA: Insufficient documentation

## 2019-03-22 NOTE — Addendum Note (Signed)
Addended by: Dorothy Spark on: 03/22/2019 01:29 PM   Modules accepted: Orders

## 2019-03-23 LAB — CYTOLOGY - PAP
Comment: NEGATIVE
Diagnosis: NEGATIVE
High risk HPV: NEGATIVE

## 2019-03-29 ENCOUNTER — Other Ambulatory Visit (INDEPENDENT_AMBULATORY_CARE_PROVIDER_SITE_OTHER): Payer: Self-pay | Admitting: Family Medicine

## 2019-03-29 DIAGNOSIS — R7303 Prediabetes: Secondary | ICD-10-CM

## 2019-04-11 ENCOUNTER — Other Ambulatory Visit: Payer: Self-pay

## 2019-04-11 ENCOUNTER — Encounter (INDEPENDENT_AMBULATORY_CARE_PROVIDER_SITE_OTHER): Payer: Self-pay | Admitting: Family Medicine

## 2019-04-11 ENCOUNTER — Ambulatory Visit (INDEPENDENT_AMBULATORY_CARE_PROVIDER_SITE_OTHER): Payer: BC Managed Care – PPO | Admitting: Family Medicine

## 2019-04-11 VITALS — BP 112/73 | HR 86 | Temp 98.2°F | Ht 64.0 in | Wt 242.0 lb

## 2019-04-11 DIAGNOSIS — F3289 Other specified depressive episodes: Secondary | ICD-10-CM | POA: Diagnosis not present

## 2019-04-11 DIAGNOSIS — I10 Essential (primary) hypertension: Secondary | ICD-10-CM | POA: Diagnosis not present

## 2019-04-11 DIAGNOSIS — R7303 Prediabetes: Secondary | ICD-10-CM | POA: Diagnosis not present

## 2019-04-11 DIAGNOSIS — Z9189 Other specified personal risk factors, not elsewhere classified: Secondary | ICD-10-CM

## 2019-04-11 DIAGNOSIS — E7849 Other hyperlipidemia: Secondary | ICD-10-CM | POA: Diagnosis not present

## 2019-04-11 DIAGNOSIS — Z6841 Body Mass Index (BMI) 40.0 and over, adult: Secondary | ICD-10-CM

## 2019-04-11 MED ORDER — METFORMIN HCL 1000 MG PO TABS: 1000.0000 mg | ORAL_TABLET | Freq: Every day | ORAL | 0 refills | Status: DC

## 2019-04-11 NOTE — Progress Notes (Signed)
Chief Complaint:   OBESITY Carly [name] is here to discuss her progress with her obesity treatment plan along with follow-up of her obesity related diagnoses. Carly [name] is on the Category 4 Plan and states she is following her eating plan approximately 80-85% of the time. Carly [name] states she is walking 1/2 mile in 10-12 minutes 3 times per week.  Today's visit was #: 10 Starting weight: 265 lbs Starting date: 11/14/2018 Today's weight: 242 lbs Today's date: 04/11/2019 Total lbs lost to date: 23 lbs Total lbs lost since last in-office visit: 4 lbs  Interim History: Drayah says that journaling has helped her with Category 4.  She is usually eating 1800-2000 calories and greater than 100 grams of protein.  Subjective:   1. Other hyperlipidemia Carly [name] has hyperlipidemia and has been trying to improve her cholesterol levels with intensive lifestyle modification including a low saturated fat diet, exercise and weight loss. She denies any chest pain, claudication or myalgias.  Lab Results  Component Value Date   ALT 28 02/21/2019   AST 20 02/21/2019   ALKPHOS 88 02/21/2019   BILITOT 0.3 02/21/2019   Lab Results  Component Value Date   CHOL 200 (H) 02/21/2019   HDL 37 (L) 02/21/2019   LDLCALC 109 (H) 02/21/2019   TRIG 313 (H) 02/21/2019   CHOLHDL 5.4 (H) 02/21/2019   2. Prediabetes Carly [name] has a diagnosis of prediabetes based on her elevated HgA1c and was informed this puts her at greater risk of developing diabetes. She continues to work on diet and exercise to decrease her risk of diabetes. She denies nausea or hypoglycemia.  She is taking metformin 1000 mg daily.  Lab Results  Component Value Date   HGBA1C 5.9 (H) 02/21/2019   Lab Results  Component Value Date   INSULIN 43.3 (H) 11/14/2018   3. Essential hypertension Review: taking medications as instructed, no medication side effects noted, no chest pain on exertion, no dyspnea on exertion, no swelling of ankles.   BP Readings from  Last 3 Encounters:  04/11/19 112/73  03/21/19 125/76  03/21/19 130/74   4. Other depression, with emotional eating Carly [name] is struggling with emotional eating and using food for comfort to the extent that it is negatively impacting her health. She has been working on behavior modification techniques to help reduce her emotional eating and has been successful. Carly [name] takes Lexapro 10 mg daily.  5. At risk for heart disease Carly [name] is at a higher than average risk for cardiovascular disease due to obesity. Reviewed: no chest pain on exertion, no dyspnea on exertion, and no swelling of ankles.  Assessment/Plan:   1. Other hyperlipidemia Cardiovascular risk and specific lipid/LDL goals reviewed.  We discussed several lifestyle modifications today and Tangee will continue to work on diet, exercise and weight loss efforts. Orders and follow up as documented in patient record.   Counseling Intensive lifestyle modifications are the first line treatment for this issue. . Dietary changes: Increase soluble fiber. Decrease simple carbohydrates. . Exercise changes: Moderate to vigorous-intensity aerobic activity 150 minutes per week if tolerated. . Lipid-lowering medications: see documented in medical record.  2. Prediabetes Carly [name] will continue to work on weight loss, exercise, and decreasing simple carbohydrates to help decrease the risk of diabetes.   Orders - metFORMIN (GLUCOPHAGE) 1000 MG tablet; Take 1 tablet (1,000 mg total) by mouth daily.  Dispense: 30 tablet; Refill: 0  3. Essential hypertension Carly [name] is working on healthy weight loss and exercise to improve blood pressure control.  We will watch for signs of hypotension as she continues her lifestyle modifications.  4. Other depression, with emotional eating Behavior modification techniques were discussed today to help Carly [name] deal with her emotional/non-hunger eating behaviors.  Orders and follow up as documented in patient record.   5. At  risk for heart disease Carly [name] was given approximately 15 minutes of coronary artery disease prevention counseling today. She is 49 y.o. female and has risk factors for heart disease including obesity. We discussed intensive lifestyle modifications today with an emphasis on specific weight loss instructions and strategies.   Repetitive spaced learning was employed today to elicit superior memory formation and behavioral change.  6. Class 3 severe obesity with serious comorbidity and body mass index (BMI) of 40.0 to 44.9 in adult, unspecified obesity type Carly [name]) Carly [name] is currently in the action stage of change. As such, her goal is to continue with weight loss efforts. She has agreed to keeping a food journal and adhering to recommended goals of 1800-2000 calories and 95+ grams of protein.   Exercise goals: As is.  Behavioral modification strategies: increasing lean protein intake.  Increase water intake by 1 bottle.  Carly [name] has agreed to follow-up with our clinic in 2 weeks. She was informed of the importance of frequent follow-up visits to maximize her success with intensive lifestyle modifications for her multiple health conditions.   Objective:   Blood pressure 112/73, pulse 86, temperature 98.2 F (36.8 C), temperature source Oral, height 5\' 4"  (1.626 m), weight 242 lb (109.8 kg), last menstrual period 03/13/2019, SpO2 98 %. Body mass index is 41.54 kg/m.  General: Cooperative, alert, well developed, in no acute distress. HEENT: Conjunctivae and lids unremarkable. Cardiovascular: Regular rhythm.  Lungs: Normal work of breathing. Neurologic: No focal deficits.   Lab Results  Component Value Date   CREATININE 1.03 (H) 02/21/2019   BUN 17 02/21/2019   NA 140 02/21/2019   K 4.5 02/21/2019   CL 101 02/21/2019   CO2 24 02/21/2019   Lab Results  Component Value Date   ALT 28 02/21/2019   AST 20 02/21/2019   ALKPHOS 88 02/21/2019   BILITOT 0.3 02/21/2019   Lab Results  Component  Value Date   HGBA1C 5.9 (H) 02/21/2019   HGBA1C 6.0 (H) 11/14/2018   Lab Results  Component Value Date   INSULIN 43.3 (H) 11/14/2018   Lab Results  Component Value Date   TSH 1.080 11/14/2018   Lab Results  Component Value Date   CHOL 200 (H) 02/21/2019   HDL 37 (L) 02/21/2019   LDLCALC 109 (H) 02/21/2019   TRIG 313 (H) 02/21/2019   CHOLHDL 5.4 (H) 02/21/2019   Lab Results  Component Value Date   WBC 8.2 02/21/2019   HGB 14.1 02/21/2019   HCT 42.9 02/21/2019   MCV 87 02/21/2019   PLT 312 02/21/2019   Attestation Statements:   Reviewed by clinician on day of visit: allergies, medications, problem list, medical history, surgical history, family history, social history, and previous encounter notes.  I, Water quality scientist, CMA, am acting as Location manager for [employer], DO.  I have reviewed the above documentation for accuracy and completeness, and I agree with the above. Briscoe Deutscher, DO

## 2019-04-14 IMAGING — MR MR 3D RECON AT SCANNER
18 of 21 series · 45 of 48 positions shown · IV contrast (Contrast agent)
Comparison: CT abdomen/pelvis dated 10/09/2015

CLINICAL DATA: Intermittent upper abdominal pain, prior
cholecystectomy

EXAM:
MRI ABDOMEN WITHOUT AND WITH CONTRAST (INCLUDING MRCP)
TECHNIQUE: Multiplanar multisequence MR imaging of the abdomen was performed
both before and after the administration of intravenous contrast.
Heavily T2-weighted images of the biliary and pancreatic ducts were
obtained, and three-dimensional MRCP images were rendered by post
processing.
CONTRAST:  10 mL Gadovist IV

[Series 4: ax haste · axial · 6.0mm · 1.56mm/px · 1 of 40 slices shown]
[im 1/40]
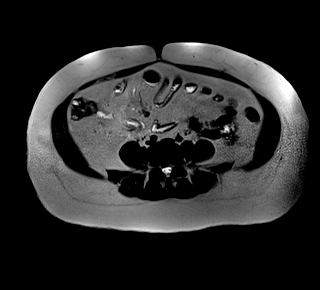

[Series 5: bSSFP · coronal · 6.0mm · 0.94mm/px · 1 of 32 slices shown]
[im 1/32]
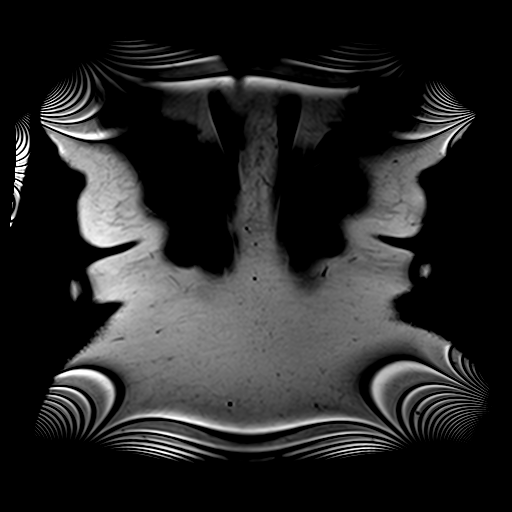

[Series 8: T2 fat-sat · axial · 6.0mm · 1.56mm/px · 1 of 40 slices shown]
[im 1/40]
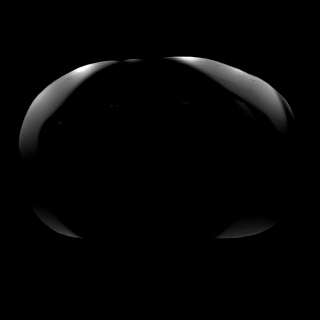

[Series 13: DWI · axial · 6.0mm · 1.87mm/px · z∈[-89,+192]mm · 4 of 120 slices shown (1 of 2)]
[im 1/120]
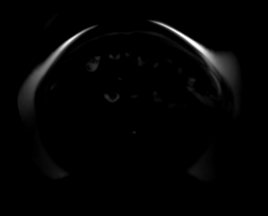
[im 40/120]
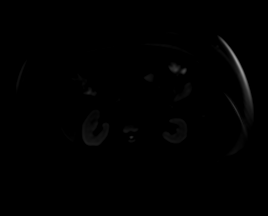
[im 80/120]
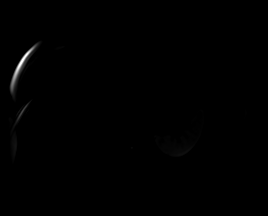
[im 120/120]
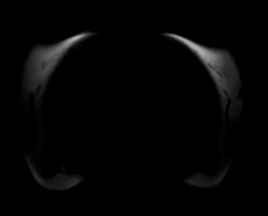

[Series 14: DWI · axial · 6.0mm · 1.87mm/px · 1 of 40 slices shown (2 of 2)]
[im 1/40]
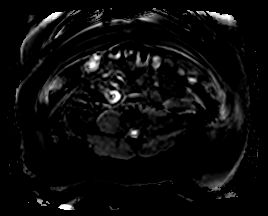

[Series 15: ax in and · axial · 3.0mm · 1.56mm/px · z∈[-70,+191]mm · 6 of 176 slices shown]
[im 1/176]
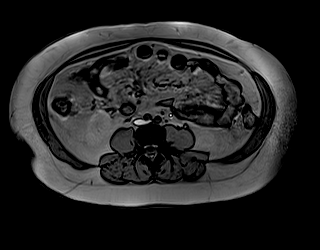
[im 36/176]
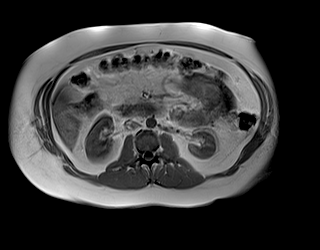
[im 71/176]
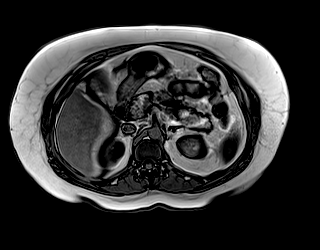
[im 106/176]
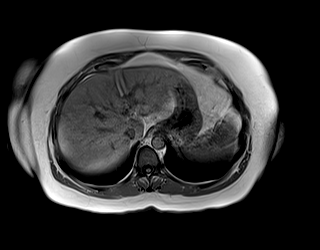
[im 141/176]
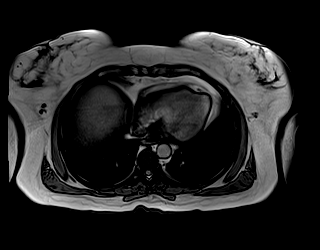
[im 176/176]
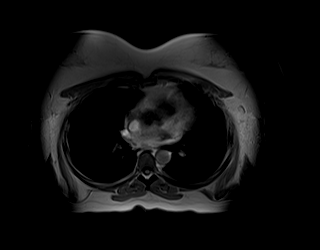

[Series 16: MRCP · coronal · 4.0mm · 1.12mm/px · 1 of 15 slices shown]
[im 1/15]
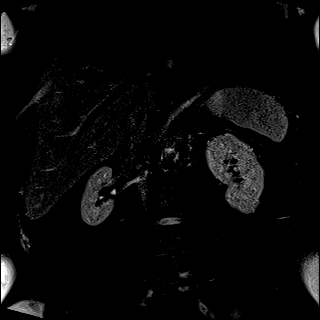

[Series 17: radials · coronal · 50.0mm · 0.78mm/px · 1 of 5 slices shown]
[im 1/5]
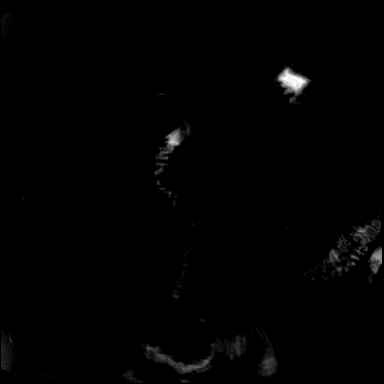

[Series 18: T1 dynamic · axial · non-contrast · 3.0mm · 1.56mm/px · z∈[-73,+188]mm · 3 of 88 slices shown (1 of 5)]
[im 1/88]
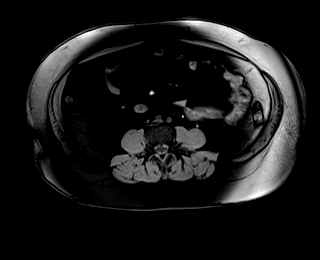
[im 44/88]
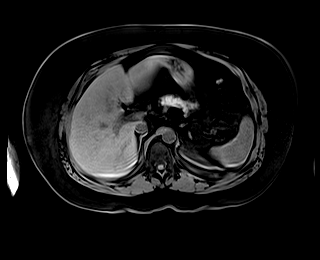
[im 88/88]
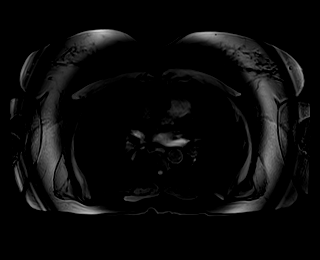

[Series 20: T1 dynamic post-contrast · axial · 3.0mm · 1.56mm/px · z∈[-73,+188]mm · 3 of 88 slices shown (1 of 5)]
[im 1/88]
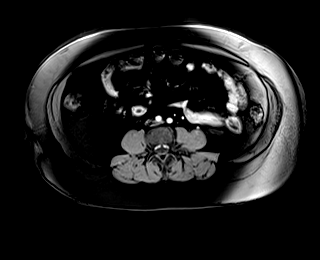
[im 44/88]
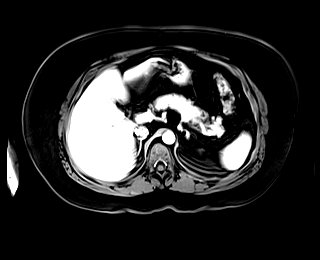
[im 88/88]
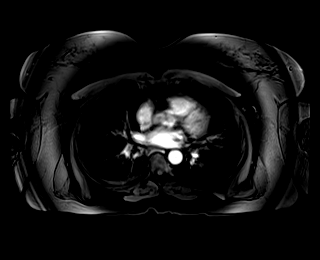

[Series 21: T1 dynamic · axial · 3.0mm · 1.56mm/px · z∈[-73,+188]mm · 3 of 88 slices shown (2 of 5)]
[im 1/88]
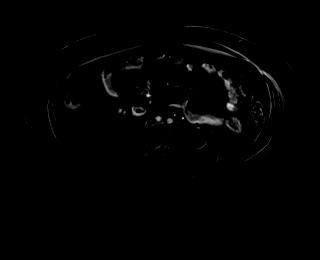
[im 44/88]
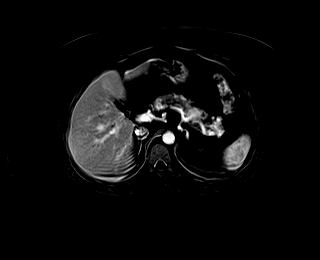
[im 88/88]
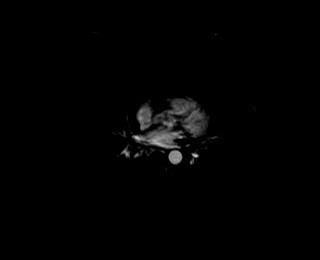

[Series 22: T1 dynamic post-contrast · axial · 3.0mm · 1.56mm/px · z∈[-73,+188]mm · 3 of 88 slices shown (2 of 5)]
[im 1/88]
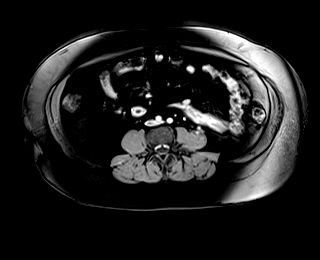
[im 44/88]
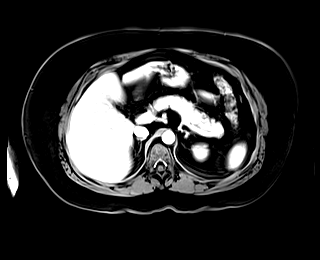
[im 88/88]
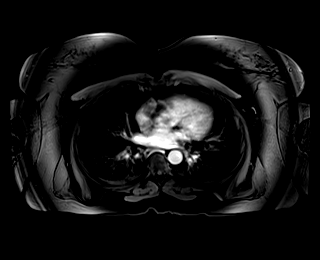

[Series 23: T1 dynamic · axial · 3.0mm · 1.56mm/px · z∈[-73,+188]mm · 3 of 88 slices shown (3 of 5)]
[im 1/88]
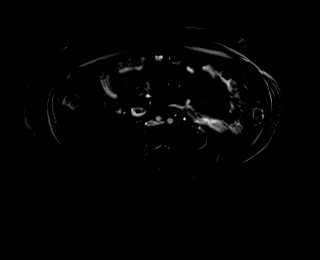
[im 44/88]
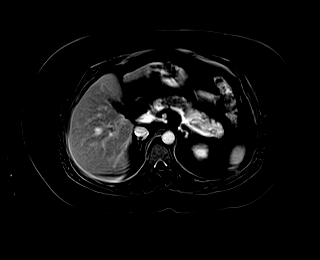
[im 88/88]
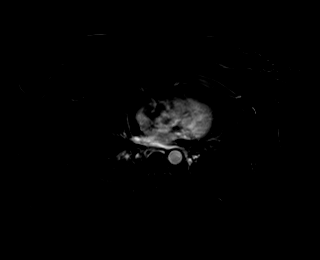

[Series 24: T1 dynamic post-contrast · axial · 3.0mm · 1.56mm/px · z∈[-73,+188]mm · 3 of 88 slices shown (3 of 5)]
[im 1/88]
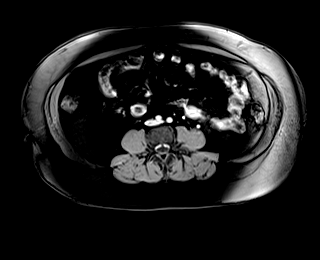
[im 44/88]
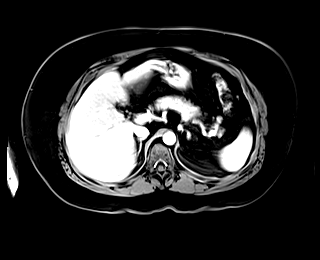
[im 88/88]
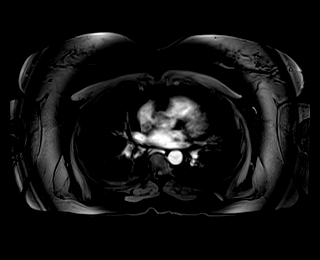

[Series 25: T1 dynamic · axial · 3.0mm · 1.56mm/px · z∈[-73,+188]mm · 3 of 88 slices shown (4 of 5)]
[im 1/88]
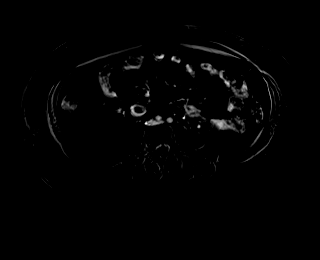
[im 44/88]
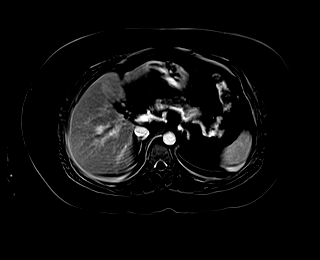
[im 88/88]
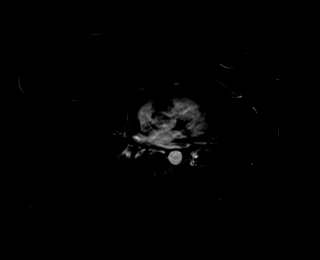

[Series 26: T1 dynamic post-contrast · axial · 3.0mm · 1.56mm/px · z∈[-73,+188]mm · 3 of 88 slices shown (4 of 5)]
[im 1/88]
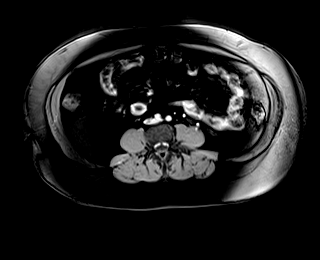
[im 44/88]
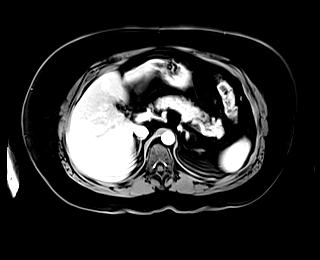
[im 88/88]
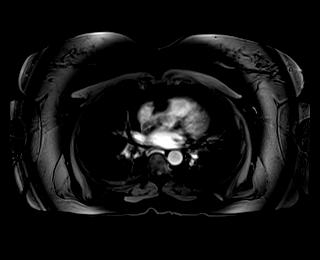

[Series 27: T1 dynamic · axial · 3.0mm · 1.56mm/px · z∈[-73,+188]mm · 3 of 88 slices shown (5 of 5)]
[im 1/88]
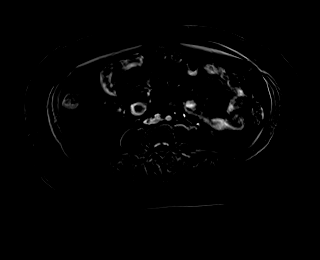
[im 44/88]
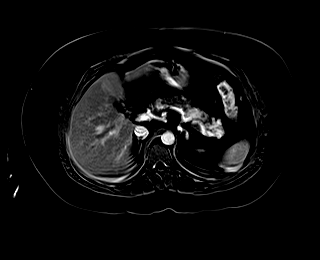
[im 88/88]
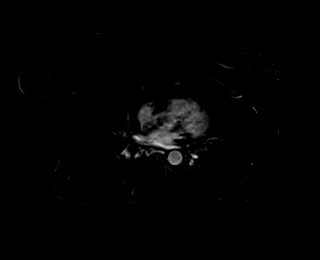

[Series 28: T1 dynamic post-contrast · coronal · 3.0mm · 1.50mm/px · 2 of 72 slices shown (5 of 5)]
[im 1/72]
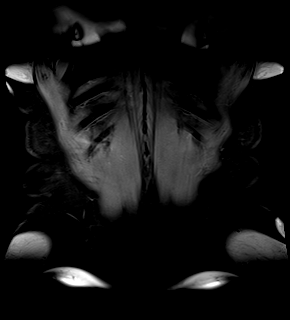
[im 72/72]
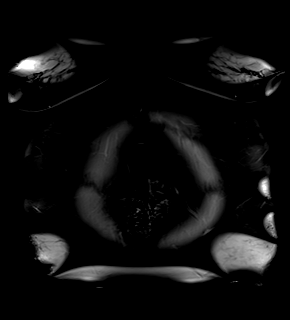

[45 of 48 positions shown; findings below may reference images not displayed]

FINDINGS: Lower chest: Lung bases are clear.

Hepatobiliary: Mild hepatic steatosis. Liver is otherwise within
normal limits. No focal hepatic lesions.

Status post cholecystectomy. No intrahepatic or extrahepatic ductal
dilatation. Common duct measures 5 mm.

Pancreas:  Within normal limits.

Spleen:  Within normal limits.

Adrenals/Urinary Tract:  Adrenal glands are within normal limits.

Kidneys are within normal limits.  No hydronephrosis.

Stomach/Bowel: Stomach is within normal limits.

Visualized bowel is unremarkable.

Vascular/Lymphatic: No evidence of abdominal aortic aneurysm.
Retroaortic left renal vein.

No suspicious abdominal lymphadenopathy.

Other:  No abdominal ascites.

Musculoskeletal: No focal osseous lesions.
IMPRESSION: Status post cholecystectomy. No intrahepatic or extrahepatic ductal
dilatation. Common duct measures 5 mm.

Mild hepatic steatosis.

Otherwise negative MRI abdomen.

## 2019-05-02 ENCOUNTER — Encounter (INDEPENDENT_AMBULATORY_CARE_PROVIDER_SITE_OTHER): Payer: Self-pay | Admitting: Family Medicine

## 2019-05-02 ENCOUNTER — Other Ambulatory Visit: Payer: Self-pay

## 2019-05-02 ENCOUNTER — Ambulatory Visit (INDEPENDENT_AMBULATORY_CARE_PROVIDER_SITE_OTHER): Payer: BC Managed Care – PPO | Admitting: Family Medicine

## 2019-05-02 VITALS — BP 110/71 | HR 86 | Temp 97.9°F | Ht 64.0 in | Wt 243.0 lb

## 2019-05-02 DIAGNOSIS — Z9189 Other specified personal risk factors, not elsewhere classified: Secondary | ICD-10-CM | POA: Diagnosis not present

## 2019-05-02 DIAGNOSIS — E782 Mixed hyperlipidemia: Secondary | ICD-10-CM

## 2019-05-02 DIAGNOSIS — F3289 Other specified depressive episodes: Secondary | ICD-10-CM

## 2019-05-02 DIAGNOSIS — R7303 Prediabetes: Secondary | ICD-10-CM

## 2019-05-02 DIAGNOSIS — Z6841 Body Mass Index (BMI) 40.0 and over, adult: Secondary | ICD-10-CM

## 2019-05-02 MED ORDER — BD PEN NEEDLE NANO 2ND GEN 32G X 4 MM MISC: 1.0000 | Freq: Every day | 0 refills | Status: DC

## 2019-05-02 MED ORDER — SAXENDA 18 MG/3ML ~~LOC~~ SOPN: 3.0000 mg | PEN_INJECTOR | Freq: Every day | SUBCUTANEOUS | 0 refills | Status: DC

## 2019-05-02 NOTE — Progress Notes (Signed)
Chief Complaint:   OBESITY Carly [name] is here to discuss her progress with her obesity treatment plan along with follow-up of her obesity related diagnoses. Carly [name] is on the Category 4 Plan and states she is following her eating plan approximately 75-80% of the time. Carly [name] states she is walking 1/2 mile 3-4 times per week.  Today's visit was #: 11 Starting weight: 265 lbs Starting date: 11/14/2018 Today's weight: 243 lbs Today's date: 05/02/2019 Total lbs lost to date: 22 lbs Total lbs lost since last in-office visit: 1 lb  Interim History: Carly [name] says she spent Easter at [employer] and had last week off as well.  She reports that she does better with a schedule.  Subjective:   1. Prediabetes Carly [name] has a diagnosis of prediabetes based on her elevated HgA1c and was informed this puts her at greater risk of developing diabetes. She continues to work on diet and exercise to decrease her risk of diabetes. She denies nausea or hypoglycemia.  Lab Results  Component Value Date   HGBA1C 5.9 (H) 02/21/2019   Lab Results  Component Value Date   INSULIN 43.3 (H) 11/14/2018   2. Mixed hyperlipidemia Carly [name] has hyperlipidemia and has been trying to improve her cholesterol levels with intensive lifestyle modification including a low saturated fat diet, exercise and weight loss. She denies any chest pain, claudication or myalgias.  Lab Results  Component Value Date   ALT 28 02/21/2019   AST 20 02/21/2019   ALKPHOS 88 02/21/2019   BILITOT 0.3 02/21/2019   Lab Results  Component Value Date   CHOL 200 (H) 02/21/2019   HDL 37 (L) 02/21/2019   LDLCALC 109 (H) 02/21/2019   TRIG 313 (H) 02/21/2019   CHOLHDL 5.4 (H) 02/21/2019   3. Other depression, with emotional eating Analize is struggling with emotional eating and using food for comfort to the extent that it is negatively impacting her health. She has been working on behavior modification techniques to help reduce her emotional eating and has  been successful. She shows no sign of suicidal or homicidal ideations.  She is taking Lexapro 10 mg daily.  4. At risk for nausea [name] is at risk for nausea due to starting Saxenda.  Assessment/Plan:   1. Prediabetes Cletta will continue to work on weight loss, exercise, and decreasing simple carbohydrates to help decrease the risk of diabetes.   2. Mixed hyperlipidemia Cardiovascular risk and specific lipid/LDL goals reviewed.  We discussed several lifestyle modifications today and Ah will continue to work on diet, exercise and weight loss efforts. Orders and follow up as documented in patient record.   Counseling Intensive lifestyle modifications are the first line treatment for this issue. . Dietary changes: Increase soluble fiber. Decrease simple carbohydrates. . Exercise changes: Moderate to vigorous-intensity aerobic activity 150 minutes per week if tolerated. . Lipid-lowering medications: see documented in medical record.  3. Other depression, with emotional eating Behavior modification techniques were discussed today to help Carly [name] deal with her emotional/non-hunger eating behaviors.  Orders and follow up as documented in patient record.   4. At risk for nausea SHAELY PROPER was given approximately 15 minutes of nausea prevention counseling today. [name] is at risk for nausea due to her new or current medication. She was encouraged to titrate her medication slowly, make sure to stay hydrated, eat smaller portions throughout the day, and avoid high fat meals.   5. Class 3 severe obesity with serious comorbidity and body mass index (BMI) of  40.0 to 44.9 in adult, unspecified obesity type ([place])  Orders - Liraglutide -Weight Management (SAXENDA) 18 MG/3ML SOPN; Inject 0.5 mLs (3 mg total) into the skin daily.  Dispense: 5 pen; Refill: 0 - Insulin Pen Needle (BD PEN NEEDLE NANO 2ND GEN) 32G X 4 MM MISC; 1 Package by Does not apply route daily.  Dispense: 100 each; Refill:  0  [name] is currently in the action stage of change. As such, her goal is to continue with weight loss efforts. She has agreed to the Category 4 Plan.   Exercise goals: For substantial health benefits, adults should do at least 150 minutes (2 hours and 30 minutes) a week of moderate-intensity, or 75 minutes (1 hour and 15 minutes) a week of vigorous-intensity aerobic physical activity, or an equivalent combination of moderate- and vigorous-intensity aerobic activity. Aerobic activity should be performed in episodes of at least 10 minutes, and preferably, it should be spread throughout the week.  Behavioral modification strategies: increasing lean protein intake and increasing water intake.  [name] has agreed to follow-up with our clinic in 2 weeks. She was informed of the importance of frequent follow-up visits to maximize her success with intensive lifestyle modifications for her multiple health conditions.   Objective:   Blood pressure 110/71, pulse 86, temperature 97.9 F (36.6 C), temperature source Oral, height 5\' 4"  (1.626 m), weight 243 lb (110.2 kg), SpO2 99 %. Body mass index is 41.71 kg/m.  General: Cooperative, alert, well developed, in no acute distress. HEENT: Conjunctivae and lids unremarkable. Cardiovascular: Regular rhythm.  Lungs: Normal work of breathing. Neurologic: No focal deficits.   Lab Results  Component Value Date   CREATININE 1.03 (H) 02/21/2019   BUN 17 02/21/2019   NA 140 02/21/2019   K 4.5 02/21/2019   CL 101 02/21/2019   CO2 24 02/21/2019   Lab Results  Component Value Date   ALT 28 02/21/2019   AST 20 02/21/2019   ALKPHOS 88 02/21/2019   BILITOT 0.3 02/21/2019   Lab Results  Component Value Date   HGBA1C 5.9 (H) 02/21/2019   HGBA1C 6.0 (H) 11/14/2018   Lab Results  Component Value Date   INSULIN 43.3 (H) 11/14/2018   Lab Results  Component Value Date   TSH 1.080 11/14/2018   Lab Results  Component Value Date   CHOL 200 (H) 02/21/2019    HDL 37 (L) 02/21/2019   LDLCALC 109 (H) 02/21/2019   TRIG 313 (H) 02/21/2019   CHOLHDL 5.4 (H) 02/21/2019   Lab Results  Component Value Date   WBC 8.2 02/21/2019   HGB 14.1 02/21/2019   HCT 42.9 02/21/2019   MCV 87 02/21/2019   PLT 312 02/21/2019   Attestation Statements:   Reviewed by clinician on day of visit: allergies, medications, problem list, medical history, surgical history, family history, social history, and previous encounter notes.  I, Water quality scientist, CMA, am acting as Location manager for [employer], DO.  I have reviewed the above documentation for accuracy and completeness, and I agree with the above. Briscoe Deutscher, DO

## 2019-05-04 ENCOUNTER — Other Ambulatory Visit (INDEPENDENT_AMBULATORY_CARE_PROVIDER_SITE_OTHER): Payer: Self-pay | Admitting: Family Medicine

## 2019-05-04 DIAGNOSIS — R7303 Prediabetes: Secondary | ICD-10-CM

## 2019-05-16 ENCOUNTER — Encounter (INDEPENDENT_AMBULATORY_CARE_PROVIDER_SITE_OTHER): Payer: Self-pay | Admitting: Physician Assistant

## 2019-05-16 ENCOUNTER — Ambulatory Visit (INDEPENDENT_AMBULATORY_CARE_PROVIDER_SITE_OTHER): Payer: BC Managed Care – PPO | Admitting: Physician Assistant

## 2019-05-16 ENCOUNTER — Other Ambulatory Visit: Payer: Self-pay

## 2019-05-16 VITALS — BP 111/67 | HR 93 | Temp 98.2°F | Ht 64.0 in | Wt 243.0 lb

## 2019-05-16 DIAGNOSIS — I10 Essential (primary) hypertension: Secondary | ICD-10-CM | POA: Diagnosis not present

## 2019-05-16 DIAGNOSIS — Z6841 Body Mass Index (BMI) 40.0 and over, adult: Secondary | ICD-10-CM

## 2019-05-16 DIAGNOSIS — Z9189 Other specified personal risk factors, not elsewhere classified: Secondary | ICD-10-CM

## 2019-05-16 DIAGNOSIS — R7303 Prediabetes: Secondary | ICD-10-CM

## 2019-05-16 MED ORDER — METFORMIN HCL 1000 MG PO TABS: 1000.0000 mg | ORAL_TABLET | Freq: Every day | ORAL | 0 refills | Status: DC

## 2019-05-16 NOTE — Progress Notes (Signed)
Chief Complaint:   OBESITY Carly [name] is here to discuss her progress with her obesity treatment plan along with follow-up of her obesity related diagnoses. Carly [name] is on the Category 4 Plan and states she is following her eating plan approximately 80-85% of the time. Carly [name] states she is walking 1/2 mile 2-3 times per week.  Today's visit was #: 12 Starting weight: 265 lbs Starting date: 11/14/2018 Today's weight: 243 lbs Today's date: 05/16/2019 Total lbs lost to date: 22 Total lbs lost since last in-office visit: 0  Interim History: Carly [name] reports that she feels her weight is "stuck." She has been getting around 95-100 grams of protein daily and is eating 1800-2000 calories daily.  Subjective:   Prediabetes. Carly [name] has a diagnosis of prediabetes based on her elevated HgA1c and was informed this puts her at greater risk of developing diabetes. She continues to work on diet and exercise to decrease her risk of diabetes. She denies nausea, vomiting, or diarrhea. No polyphagia. Carly [name] is on metformin. She is due for labs.  Lab Results  Component Value Date   HGBA1C 5.9 (H) 02/21/2019   Lab Results  Component Value Date   INSULIN 43.3 (H) 11/14/2018   Essential hypertension. Carly [name] is on lisinopril/HCTZ. No chest pain. No headache. Blood pressure is controlled today.  BP Readings from Last 3 Encounters:  05/16/19 111/67  05/02/19 110/71  04/11/19 112/73   Lab Results  Component Value Date   CREATININE 1.03 (H) 02/21/2019   CREATININE 0.75 11/14/2018   CREATININE 0.82 01/09/2018   At risk for diabetes mellitus. [name] is at higher than average risk for developing diabetes due to her obesity.   Assessment/Plan:   Prediabetes. Carly [name] will continue to work on weight loss, exercise, and decreasing simple carbohydrates to help decrease the risk of diabetes. Refill was given for metFORMIN (GLUCOPHAGE) 1000 MG tablet #300 with 0 refills. Will recheck labs at her next visit and  assess whether or not to continue metformin.  Essential hypertension. Carly [name] is working on healthy weight loss and exercise to improve blood pressure control. We will watch for signs of hypotension as she continues her lifestyle modifications. She will continue her medications as directed.  At risk for diabetes mellitus. Carly [name] was given approximately 15 minutes of diabetes education and counseling today. We discussed intensive lifestyle modifications today with an emphasis on weight loss as well as increasing exercise and decreasing simple carbohydrates in her diet. We also reviewed medication options with an emphasis on risk versus benefit of those discussed.   Repetitive spaced learning was employed today to elicit superior memory formation and behavioral change.  Class 3 severe obesity with serious comorbidity and body mass index (BMI) of 40.0 to 44.9 in adult, unspecified obesity type ([place]).   Carly [name] is currently in the action stage of change. As such, her goal is to continue with weight loss efforts. She has agreed to following a lower carbohydrate, vegetable and lean protein rich diet plan.   Exercise goals: For substantial health benefits, adults should do at least 150 minutes (2 hours and 30 minutes) a week of moderate-intensity, or 75 minutes (1 hour and 15 minutes) a week of vigorous-intensity aerobic physical activity, or an equivalent combination of moderate- and vigorous-intensity aerobic activity. Aerobic activity should be performed in episodes of at least 10 minutes, and preferably, it should be spread throughout the week.  Behavioral modification strategies: meal planning and cooking strategies and keeping healthy foods in the home.  Carly [name] has agreed to follow-up with our clinic in 2-3 weeks. She was informed of the importance of frequent follow-up visits to maximize her success with intensive lifestyle modifications for her multiple health conditions.   Objective:   Blood pressure  111/67, pulse 93, temperature 98.2 F (36.8 C), temperature source Oral, height 5\' 4"  (1.626 m), weight 243 lb (110.2 kg), SpO2 98 %. Body mass index is 41.71 kg/m.  General: Cooperative, alert, well developed, in no acute distress. HEENT: Conjunctivae and lids unremarkable. Cardiovascular: Regular rhythm.  Lungs: Normal work of breathing. Neurologic: No focal deficits.   Lab Results  Component Value Date   CREATININE 1.03 (H) 02/21/2019   BUN 17 02/21/2019   NA 140 02/21/2019   K 4.5 02/21/2019   CL 101 02/21/2019   CO2 24 02/21/2019   Lab Results  Component Value Date   ALT 28 02/21/2019   AST 20 02/21/2019   ALKPHOS 88 02/21/2019   BILITOT 0.3 02/21/2019   Lab Results  Component Value Date   HGBA1C 5.9 (H) 02/21/2019   HGBA1C 6.0 (H) 11/14/2018   Lab Results  Component Value Date   INSULIN 43.3 (H) 11/14/2018   Lab Results  Component Value Date   TSH 1.080 11/14/2018   Lab Results  Component Value Date   CHOL 200 (H) 02/21/2019   HDL 37 (L) 02/21/2019   LDLCALC 109 (H) 02/21/2019   TRIG 313 (H) 02/21/2019   CHOLHDL 5.4 (H) 02/21/2019   Lab Results  Component Value Date   WBC 8.2 02/21/2019   HGB 14.1 02/21/2019   HCT 42.9 02/21/2019   MCV 87 02/21/2019   PLT 312 02/21/2019   No results found for: IRON, TIBC, FERRITIN  Attestation Statements:   Reviewed by clinician on day of visit: allergies, medications, problem list, medical history, surgical history, family history, social history, and previous encounter notes.  IMichaelene Song, am acting as transcriptionist for Abby Potash, PA-C   I have reviewed the above documentation for accuracy and completeness, and I agree with the above. Abby Potash, PA-C

## 2019-05-21 ENCOUNTER — Encounter (INDEPENDENT_AMBULATORY_CARE_PROVIDER_SITE_OTHER): Payer: Self-pay | Admitting: Physician Assistant

## 2019-05-21 NOTE — Telephone Encounter (Signed)
Please advise 

## 2019-05-29 ENCOUNTER — Other Ambulatory Visit: Payer: Self-pay | Admitting: Internal Medicine

## 2019-05-30 ENCOUNTER — Other Ambulatory Visit (INDEPENDENT_AMBULATORY_CARE_PROVIDER_SITE_OTHER): Payer: Self-pay | Admitting: Family Medicine

## 2019-05-30 DIAGNOSIS — Z6841 Body Mass Index (BMI) 40.0 and over, adult: Secondary | ICD-10-CM

## 2019-06-06 ENCOUNTER — Ambulatory Visit (INDEPENDENT_AMBULATORY_CARE_PROVIDER_SITE_OTHER): Payer: BC Managed Care – PPO | Admitting: Physician Assistant

## 2019-06-06 ENCOUNTER — Other Ambulatory Visit: Payer: Self-pay

## 2019-06-06 ENCOUNTER — Encounter (INDEPENDENT_AMBULATORY_CARE_PROVIDER_SITE_OTHER): Payer: Self-pay | Admitting: Physician Assistant

## 2019-06-06 VITALS — BP 104/70 | HR 82 | Temp 97.8°F | Ht 64.0 in | Wt 238.0 lb

## 2019-06-06 DIAGNOSIS — E7849 Other hyperlipidemia: Secondary | ICD-10-CM | POA: Diagnosis not present

## 2019-06-06 DIAGNOSIS — E559 Vitamin D deficiency, unspecified: Secondary | ICD-10-CM

## 2019-06-06 DIAGNOSIS — E66813 Obesity, class 3: Secondary | ICD-10-CM

## 2019-06-06 DIAGNOSIS — Z9189 Other specified personal risk factors, not elsewhere classified: Secondary | ICD-10-CM | POA: Diagnosis not present

## 2019-06-06 DIAGNOSIS — R7303 Prediabetes: Secondary | ICD-10-CM | POA: Diagnosis not present

## 2019-06-06 DIAGNOSIS — E538 Deficiency of other specified B group vitamins: Secondary | ICD-10-CM | POA: Diagnosis not present

## 2019-06-06 DIAGNOSIS — Z6841 Body Mass Index (BMI) 40.0 and over, adult: Secondary | ICD-10-CM

## 2019-06-06 NOTE — Progress Notes (Signed)
Chief Complaint:   OBESITY Carly [name] is here to discuss her progress with her obesity treatment plan along with follow-up of her obesity related diagnoses. Carly [name] is on the Category 4 Plan and following a lower carbohydrate, vegetable and lean protein rich diet plan and states she is following her eating plan approximately 90% of the time. Carly [name] states she is walking 1/2 mile 2 times per week.  Today's visit was #: 19 Starting weight: 265 lbs Starting date: 11/14/2018 Today's weight: 238 lbs Today's date: 06/06/2019 Total lbs lost to date: 27 lbs Total lbs lost since last in-office visit: 5 lbs  Interim History: Carly [name] did 2 weeks of low carb and then the rest of the time did Category 4/journaling.  She denies excessive hunger.  Subjective:   1. Vitamin D deficiency Carly [name]'s Vitamin D level was 58.6 on 02/21/2019. She is currently taking OTC vitamin D 1000 IU each day. She denies nausea, vomiting or muscle weakness.  2. Prediabetes Carly [name] has a diagnosis of prediabetes based on her elevated HgA1c and was informed this puts her at greater risk of developing diabetes. She continues to work on diet and exercise to decrease her risk of diabetes. She denies hypoglycemia.  She is taking Saxenda and metformin.  No nausea, vomiting, diarrhea, or polyphagia.  She is due for labs.  Lab Results  Component Value Date   HGBA1C 5.9 (H) 02/21/2019   Lab Results  Component Value Date   INSULIN 43.3 (H) 11/14/2018   3. Other hyperlipidemia Carly [name] has hyperlipidemia and has been trying to improve her cholesterol levels with intensive lifestyle modification including a low saturated fat diet, exercise and weight loss. She denies any chest pain, claudication or myalgias.  Last level not at goal.  She is not on any medications.   Lab Results  Component Value Date   ALT 28 02/21/2019   AST 20 02/21/2019   ALKPHOS 88 02/21/2019   BILITOT 0.3 02/21/2019   Lab Results  Component Value Date   CHOL 200  (H) 02/21/2019   HDL 37 (L) 02/21/2019   LDLCALC 109 (H) 02/21/2019   TRIG 313 (H) 02/21/2019   CHOLHDL 5.4 (H) 02/21/2019   4. B12 nutritional deficiency She is not a vegetarian.  She does not have a previous diagnosis of pernicious anemia.  She does not have a history of weight loss surgery.  She is taking OTC B12.  No excessive fatigue.  Lab Results  Component Value Date   VITAMINB12 317 02/21/2019   5. At risk for heart disease Carly [name] is at a higher than average risk for cardiovascular disease due to obesity.   Assessment/Plan:   1. Vitamin D deficiency Low Vitamin D level contributes to fatigue and are associated with obesity, breast, and colon cancer. She agrees to continue to take prescription Vitamin D @50 ,000 IU every week and will follow-up for routine testing of Vitamin D, at least 2-3 times per year to avoid over-replacement. - VITAMIN D 25 Hydroxy (Vit-D Deficiency, Fractures)  2. Prediabetes Carly [name] will continue to work on weight loss, exercise, and decreasing simple carbohydrates to help decrease the risk of diabetes.   3. Other hyperlipidemia Cardiovascular risk and specific lipid/LDL goals reviewed.  We discussed several lifestyle modifications today and Carly [name] will continue to work on diet, exercise and weight loss efforts. Orders and follow up as documented in patient record.   Counseling Intensive lifestyle modifications are the first line treatment for this issue. . Dietary changes: Increase soluble fiber.  Decrease simple carbohydrates. . Exercise changes: Moderate to vigorous-intensity aerobic activity 150 minutes per week if tolerated. . Lipid-lowering medications: see documented in medical record. - Comprehensive metabolic panel - Hemoglobin A1c - Insulin, random - Lipid Panel With LDL/HDL Ratio  4. B12 nutritional deficiency The diagnosis was reviewed with the patient. Counseling provided today, see below. We will continue to monitor. Orders and follow up  as documented in patient record.  Counseling . The body needs vitamin B12: to make red blood cells; to make DNA; and to help the nerves work properly so they can carry messages from the brain to the body.  . The main causes of vitamin B12 deficiency include dietary deficiency, digestive diseases, pernicious anemia, and having a surgery in which part of the stomach or small intestine is removed.  . Certain medicines can make it harder for the body to absorb vitamin B12. These medicines include: heartburn medications; some antibiotics; some medications used to treat diabetes, gout, and high cholesterol.  . In some cases, there are no symptoms of this condition. If the condition leads to anemia or nerve damage, various symptoms can occur, such as weakness or fatigue, shortness of breath, and numbness or tingling in your hands and feet.   . Treatment:  o May include taking vitamin B12 supplements.  o Avoid alcohol.  o Eat lots of healthy foods that contain vitamin B12: - Beef, pork, chicken, [place], and organ meats, such as liver.  - Seafood: This includes clams, rainbow trout, salmon, tuna, and haddock. Eggs.  - Cereal and dairy products that are fortified: This means that vitamin B12 has been added to the food.  - Vitamin B12  5. At risk for heart disease Carly [name] was given approximately 15 minutes of coronary artery disease prevention counseling today. She is 49 y.o. female and has risk factors for heart disease including obesity. We discussed intensive lifestyle modifications today with an emphasis on specific weight loss instructions and strategies.   Repetitive spaced learning was employed today to elicit superior memory formation and behavioral change.  6. Class 3 severe obesity with serious comorbidity and body mass index (BMI) of 40.0 to 44.9 in adult, unspecified obesity type Carly [name]) Carly [name] is currently in the action stage of change. As such, her goal is to continue with weight loss efforts.  She has agreed to keeping a food journal and adhering to recommended goals of 1800 calories and 115 grams of protein.   Exercise goals: For substantial health benefits, adults should do at least 150 minutes (2 hours and 30 minutes) a week of moderate-intensity, or 75 minutes (1 hour and 15 minutes) a week of vigorous-intensity aerobic physical activity, or an equivalent combination of moderate- and vigorous-intensity aerobic activity. Aerobic activity should be performed in episodes of at least 10 minutes, and preferably, it should be spread throughout the week.  Behavioral modification strategies: increasing lean protein intake and meal planning and cooking strategies.  Carly [name] has agreed to follow-up with our clinic in 2-3 weeks. She was informed of the importance of frequent follow-up visits to maximize her success with intensive lifestyle modifications for her multiple health conditions.   Carly [name] was informed we would discuss her lab results at her next visit unless there is a critical issue that needs to be addressed sooner. Carly [name] agreed to keep her next visit at the agreed upon time to discuss these results.  Objective:   Blood pressure 104/70, pulse 82, temperature 97.8 F (36.6 C), temperature source Oral, height 5'  4" (1.626 m), weight 238 lb (108 kg), SpO2 98 %. Body mass index is 40.85 kg/m.  General: Cooperative, alert, well developed, in no acute distress. HEENT: Conjunctivae and lids unremarkable. Cardiovascular: Regular rhythm.  Lungs: Normal work of breathing. Neurologic: No focal deficits.   Lab Results  Component Value Date   CREATININE 1.03 (H) 02/21/2019   BUN 17 02/21/2019   NA 140 02/21/2019   K 4.5 02/21/2019   CL 101 02/21/2019   CO2 24 02/21/2019   Lab Results  Component Value Date   ALT 28 02/21/2019   AST 20 02/21/2019   ALKPHOS 88 02/21/2019   BILITOT 0.3 02/21/2019   Lab Results  Component Value Date   HGBA1C 5.9 (H) 02/21/2019   HGBA1C 6.0 (H)  11/14/2018   Lab Results  Component Value Date   INSULIN 43.3 (H) 11/14/2018   Lab Results  Component Value Date   TSH 1.080 11/14/2018   Lab Results  Component Value Date   CHOL 200 (H) 02/21/2019   HDL 37 (L) 02/21/2019   LDLCALC 109 (H) 02/21/2019   TRIG 313 (H) 02/21/2019   CHOLHDL 5.4 (H) 02/21/2019   Lab Results  Component Value Date   WBC 8.2 02/21/2019   HGB 14.1 02/21/2019   HCT 42.9 02/21/2019   MCV 87 02/21/2019   PLT 312 02/21/2019   Attestation Statements:   Reviewed by clinician on day of visit: allergies, medications, problem list, medical history, surgical history, family history, social history, and previous encounter notes.  I, Water quality scientist, CMA, am acting as Location manager for [employer], PA-C.  I have reviewed the above documentation for accuracy and completeness, and I agree with the above. Abby Potash, PA-C

## 2019-06-07 ENCOUNTER — Other Ambulatory Visit: Payer: Self-pay | Admitting: Nurse Practitioner

## 2019-06-07 LAB — COMPREHENSIVE METABOLIC PANEL WITH GFR
ALT: 49 IU/L — ABNORMAL HIGH (ref 0–32)
AST: 30 IU/L (ref 0–40)
Albumin/Globulin Ratio: 2 (ref 1.2–2.2)
Albumin: 4.7 g/dL (ref 3.8–4.8)
Alkaline Phosphatase: 89 IU/L (ref 48–121)
BUN/Creatinine Ratio: 21 (ref 9–23)
BUN: 20 mg/dL (ref 6–24)
Bilirubin Total: 0.3 mg/dL (ref 0.0–1.2)
CO2: 22 mmol/L (ref 20–29)
Calcium: 9.9 mg/dL (ref 8.7–10.2)
Chloride: 102 mmol/L (ref 96–106)
Creatinine, Ser: 0.94 mg/dL (ref 0.57–1.00)
GFR calc Af Amer: 84 mL/min/1.73
GFR calc non Af Amer: 72 mL/min/1.73
Globulin, Total: 2.3 g/dL (ref 1.5–4.5)
Glucose: 90 mg/dL (ref 65–99)
Potassium: 4.4 mmol/L (ref 3.5–5.2)
Sodium: 141 mmol/L (ref 134–144)
Total Protein: 7 g/dL (ref 6.0–8.5)

## 2019-06-07 LAB — LIPID PANEL WITH LDL/HDL RATIO
Cholesterol, Total: 213 mg/dL — ABNORMAL HIGH (ref 100–199)
HDL: 39 mg/dL — ABNORMAL LOW (ref >39)
LDL Chol Calc (NIH): 145 mg/dL — ABNORMAL HIGH (ref 0–99)
LDL/HDL Ratio: 3.7 ratio — ABNORMAL HIGH (ref 0.0–3.2)
Triglycerides: 161 mg/dL — ABNORMAL HIGH (ref 0–149)
VLDL Cholesterol Cal: 29 mg/dL (ref 5–40)

## 2019-06-07 LAB — HEMOGLOBIN A1C
Est. average glucose Bld gHb Est-mCnc: 114 mg/dL
Hgb A1c MFr Bld: 5.6 % (ref 4.8–5.6)

## 2019-06-07 LAB — INSULIN, RANDOM: INSULIN: 23.3 u[IU]/mL (ref 2.6–24.9)

## 2019-06-07 LAB — VITAMIN B12: Vitamin B-12: 836 pg/mL (ref 232–1245)

## 2019-06-07 LAB — VITAMIN D 25 HYDROXY (VIT D DEFICIENCY, FRACTURES): Vit D, 25-Hydroxy: 68.6 ng/mL (ref 30.0–100.0)

## 2019-06-12 ENCOUNTER — Other Ambulatory Visit (INDEPENDENT_AMBULATORY_CARE_PROVIDER_SITE_OTHER): Payer: Self-pay | Admitting: Physician Assistant

## 2019-06-12 DIAGNOSIS — R7303 Prediabetes: Secondary | ICD-10-CM

## 2019-06-27 ENCOUNTER — Encounter (INDEPENDENT_AMBULATORY_CARE_PROVIDER_SITE_OTHER): Payer: Self-pay | Admitting: Physician Assistant

## 2019-06-27 ENCOUNTER — Ambulatory Visit (INDEPENDENT_AMBULATORY_CARE_PROVIDER_SITE_OTHER): Payer: BC Managed Care – PPO | Admitting: Physician Assistant

## 2019-06-27 ENCOUNTER — Other Ambulatory Visit: Payer: Self-pay

## 2019-06-27 VITALS — BP 104/69 | HR 86 | Temp 98.4°F | Ht 64.0 in | Wt 240.0 lb

## 2019-06-27 DIAGNOSIS — R7303 Prediabetes: Secondary | ICD-10-CM | POA: Diagnosis not present

## 2019-06-27 DIAGNOSIS — Z9189 Other specified personal risk factors, not elsewhere classified: Secondary | ICD-10-CM

## 2019-06-27 DIAGNOSIS — E559 Vitamin D deficiency, unspecified: Secondary | ICD-10-CM

## 2019-06-27 DIAGNOSIS — Z6841 Body Mass Index (BMI) 40.0 and over, adult: Secondary | ICD-10-CM

## 2019-06-27 MED ORDER — METFORMIN HCL 1000 MG PO TABS: 1000.0000 mg | ORAL_TABLET | Freq: Every day | ORAL | 0 refills | Status: DC

## 2019-06-27 NOTE — Progress Notes (Signed)
Chief Complaint:   Carly [name] is here to discuss her progress with her Carly treatment plan along with follow-up of her Carly related diagnoses. [name] is on the Category 4 Plan and states she is following her eating plan approximately 75% of the time. [name] states she is exercising 0 minutes 0 times per week.  Today's visit was #: 14 Starting weight: 265 lbs Starting date: 11/14/2018 Today's weight: 240 lbs Today's date: 06/27/2019 Total lbs lost to date: 25 Total lbs lost since last in-office visit: 0  Interim History: Carly [name] states that she had an emotional 3 weeks and did a lot of comfort eating. She loves the plan and wants to continue with it.  Subjective:   Prediabetes. Carly [name] has a diagnosis of prediabetes based on her elevated HgA1c and was informed this puts her at greater risk of developing diabetes. She continues to work on diet and exercise to decrease her risk of diabetes. She denies nausea or hypoglycemia. A1c is 5.6, improved from 5.9. No polyphagia.  Lab Results  Component Value Date   HGBA1C 5.6 06/06/2019   Lab Results  Component Value Date   INSULIN 23.3 06/06/2019   INSULIN 43.3 (H) 11/14/2018   Vitamin D deficiency. Last Vitamin D was 68.6 on 06/06/2019. Nathania is taking Vitamin D 1,000 units daily.  At risk for diabetes mellitus. Carly [name] is at higher than average risk for developing diabetes due to her Carly.   Assessment/Plan:   Prediabetes. [name] will continue to work on weight loss, exercise, and decreasing simple carbohydrates to help decrease the risk of diabetes. Refill was given for metFORMIN (GLUCOPHAGE) 1000 MG tablet #30 with 0 refills.  Vitamin D deficiency. Low Vitamin D level contributes to fatigue and are associated with Carly, breast, and colon cancer. She will change and take Vitamin D 1,000 units every other day (no prescription) and will follow-up for routine testing of Vitamin D, at least 2-3 times per year to avoid  over-replacement.  At risk for diabetes mellitus. Casey was given approximately 15 minutes of diabetes education and counseling today. We discussed intensive lifestyle modifications today with an emphasis on weight loss as well as increasing exercise and decreasing simple carbohydrates in her diet. We also reviewed medication options with an emphasis on risk versus benefit of those discussed.   Repetitive spaced learning was employed today to elicit superior memory formation and behavioral change.  Class 3 severe Carly with serious comorbidity and body mass index (BMI) of 40.0 to 44.9 in adult, unspecified Carly type ([place]).  Carly [name] is currently in the action stage of change. As such, her goal is to continue with weight loss efforts. She has agreed to the Category 4 Plan.   Exercise goals: For substantial health benefits, adults should do at least 150 minutes (2 hours and 30 minutes) a week of moderate-intensity, or 75 minutes (1 hour and 15 minutes) a week of vigorous-intensity aerobic physical activity, or an equivalent combination of moderate- and vigorous-intensity aerobic activity. Aerobic activity should be performed in episodes of at least 10 minutes, and preferably, it should be spread throughout the week.  Behavioral modification strategies: meal planning and cooking strategies and keeping healthy foods in the home.  Carly [name] has agreed to follow-up with our clinic in 2 weeks. She was informed of the importance of frequent follow-up visits to maximize her success with intensive lifestyle modifications for her multiple health conditions.   Objective:   Blood pressure 104/69, pulse 86, temperature 98.4  F (36.9 C), temperature source Oral, height 5\' 4"  (1.626 m), weight 240 lb (108.9 kg), SpO2 98 %. Body mass index is 41.2 kg/m.  General: Cooperative, alert, well developed, in no acute distress. HEENT: Conjunctivae and lids unremarkable. Cardiovascular: Regular rhythm.  Lungs: Normal  work of breathing. Neurologic: No focal deficits.   Lab Results  Component Value Date   CREATININE 0.94 06/06/2019   BUN 20 06/06/2019   NA 141 06/06/2019   K 4.4 06/06/2019   CL 102 06/06/2019   CO2 22 06/06/2019   Lab Results  Component Value Date   ALT 49 (H) 06/06/2019   AST 30 06/06/2019   ALKPHOS 89 06/06/2019   BILITOT 0.3 06/06/2019   Lab Results  Component Value Date   HGBA1C 5.6 06/06/2019   HGBA1C 5.9 (H) 02/21/2019   HGBA1C 6.0 (H) 11/14/2018   Lab Results  Component Value Date   INSULIN 23.3 06/06/2019   INSULIN 43.3 (H) 11/14/2018   Lab Results  Component Value Date   TSH 1.080 11/14/2018   Lab Results  Component Value Date   CHOL 213 (H) 06/06/2019   HDL 39 (L) 06/06/2019   LDLCALC 145 (H) 06/06/2019   TRIG 161 (H) 06/06/2019   CHOLHDL 5.4 (H) 02/21/2019   Lab Results  Component Value Date   WBC 8.2 02/21/2019   HGB 14.1 02/21/2019   HCT 42.9 02/21/2019   MCV 87 02/21/2019   PLT 312 02/21/2019   No results found for: IRON, TIBC, FERRITIN  Attestation Statements:   Reviewed by clinician on day of visit: allergies, medications, problem list, medical history, surgical history, family history, social history, and previous encounter notes.  IMichaelene Song, am acting as transcriptionist for Abby Potash, PA-C   I have reviewed the above documentation for accuracy and completeness, and I agree with the above. Abby Potash, PA-C

## 2019-07-05 ENCOUNTER — Other Ambulatory Visit: Payer: Self-pay | Admitting: Internal Medicine

## 2019-07-09 ENCOUNTER — Other Ambulatory Visit: Payer: Self-pay

## 2019-07-09 ENCOUNTER — Encounter (INDEPENDENT_AMBULATORY_CARE_PROVIDER_SITE_OTHER): Payer: Self-pay | Admitting: Physician Assistant

## 2019-07-09 ENCOUNTER — Ambulatory Visit (INDEPENDENT_AMBULATORY_CARE_PROVIDER_SITE_OTHER): Payer: BC Managed Care – PPO | Admitting: Physician Assistant

## 2019-07-09 VITALS — BP 108/71 | HR 88 | Temp 98.4°F | Ht 64.0 in | Wt 240.0 lb

## 2019-07-09 DIAGNOSIS — E559 Vitamin D deficiency, unspecified: Secondary | ICD-10-CM | POA: Diagnosis not present

## 2019-07-09 DIAGNOSIS — Z6841 Body Mass Index (BMI) 40.0 and over, adult: Secondary | ICD-10-CM | POA: Diagnosis not present

## 2019-07-09 DIAGNOSIS — I10 Essential (primary) hypertension: Secondary | ICD-10-CM

## 2019-07-10 NOTE — Progress Notes (Signed)
Chief Complaint:   Carly [name] is here to discuss her progress with her Carly treatment plan along with follow-up of her Carly related diagnoses. Carly [name] is on the Category 4 Plan and states she is following her eating plan approximately 85-90% of the time. Carly [name] states she is walking 30 minutes 3-4 times per week.  Today's visit was #: 15 Starting weight: 265 lbs Starting date: 11/14/2018 Today's weight: 240 lbs Today's date: 07/09/2019 Total lbs lost to date: 25 Total lbs lost since last in-office visit: 0  Interim History: Carly [name] states that she has done a better job staying on plan the past few weeks. She reports her Carly [name] is helping control her appetite.  Subjective:   Vitamin D deficiency. Carly [name] is on OTC Vitamin D daily. No nausea, vomiting, or muscle weakness. Last Vitamin D was 68.6 on 06/06/2019.  Essential hypertension. Carly [name] is on Zestoretic. No chest pain or headache. Blood pressure is controlled.  BP Readings from Last 3 Encounters:  07/09/19 108/71  06/27/19 104/69  06/06/19 104/70   Lab Results  Component Value Date   CREATININE 0.94 06/06/2019   CREATININE 1.03 (H) 02/21/2019   CREATININE 0.75 11/14/2018   Assessment/Plan:   Vitamin D deficiency. Low Vitamin D level contributes to fatigue and are associated with Carly, breast, and colon cancer. She agrees to continue to take OTC Vitamin D and will follow-up for routine testing of Vitamin D, at least 2-3 times per year to avoid over-replacement.  Essential hypertension. Carly [name] is working on healthy weight loss and exercise to improve blood pressure control. We will watch for signs of hypotension as she continues her lifestyle modifications. She will continue her medication as directed.  Class 3 severe Carly with serious comorbidity and body mass index (BMI) of 40.0 to 44.9 in adult, unspecified Carly type ([place]).  Carly [name] is currently in the action stage of change. As such, her goal is  to continue with weight loss efforts. She has agreed to keeping a food journal and adhering to recommended goals of 1800 calories and 115 grams of protein daily.   Exercise goals: For substantial health benefits, adults should do at least 150 minutes (2 hours and 30 minutes) a week of moderate-intensity, or 75 minutes (1 hour and 15 minutes) a week of vigorous-intensity aerobic physical activity, or an equivalent combination of moderate- and vigorous-intensity aerobic activity. Aerobic activity should be performed in episodes of at least 10 minutes, and preferably, it should be spread throughout the week.  Behavioral modification strategies: meal planning and cooking strategies and keeping healthy foods in the home.  Carly [name] has agreed to follow-up with our clinic in 2 weeks. She was informed of the importance of frequent follow-up visits to maximize her success with intensive lifestyle modifications for her multiple health conditions.   Objective:   Blood pressure 108/71, pulse 88, temperature 98.4 F (36.9 C), temperature source Oral, height 5\' 4"  (1.626 m), weight 240 lb (108.9 kg), SpO2 98 %. Body mass index is 41.2 kg/m.  General: Cooperative, alert, well developed, in no acute distress. HEENT: Conjunctivae and lids unremarkable. Cardiovascular: Regular rhythm.  Lungs: Normal work of breathing. Neurologic: No focal deficits.   Lab Results  Component Value Date   CREATININE 0.94 06/06/2019   BUN 20 06/06/2019   NA 141 06/06/2019   K 4.4 06/06/2019   CL 102 06/06/2019   CO2 22 06/06/2019   Lab Results  Component Value Date   ALT 49 (H) 06/06/2019  AST 30 06/06/2019   ALKPHOS 89 06/06/2019   BILITOT 0.3 06/06/2019   Lab Results  Component Value Date   HGBA1C 5.6 06/06/2019   HGBA1C 5.9 (H) 02/21/2019   HGBA1C 6.0 (H) 11/14/2018   Lab Results  Component Value Date   INSULIN 23.3 06/06/2019   INSULIN 43.3 (H) 11/14/2018   Lab Results  Component Value Date   TSH 1.080  11/14/2018   Lab Results  Component Value Date   CHOL 213 (H) 06/06/2019   HDL 39 (L) 06/06/2019   LDLCALC 145 (H) 06/06/2019   TRIG 161 (H) 06/06/2019   CHOLHDL 5.4 (H) 02/21/2019   Lab Results  Component Value Date   WBC 8.2 02/21/2019   HGB 14.1 02/21/2019   HCT 42.9 02/21/2019   MCV 87 02/21/2019   PLT 312 02/21/2019   No results found for: IRON, TIBC, FERRITIN  Attestation Statements:   Reviewed by clinician on day of visit: allergies, medications, problem list, medical history, surgical history, family history, social history, and previous encounter notes.  Time spent on visit including pre-visit chart review and post-visit charting and care was 30 minutes.   IMichaelene Song, am acting as transcriptionist for Abby Potash, PA-C   I have reviewed the above documentation for accuracy and completeness, and I agree with the above. Abby Potash, PA-C

## 2019-07-17 ENCOUNTER — Ambulatory Visit (INDEPENDENT_AMBULATORY_CARE_PROVIDER_SITE_OTHER): Payer: BC Managed Care – PPO | Admitting: Physician Assistant

## 2019-07-25 ENCOUNTER — Ambulatory Visit (INDEPENDENT_AMBULATORY_CARE_PROVIDER_SITE_OTHER): Payer: BC Managed Care – PPO | Admitting: Family Medicine

## 2019-07-25 ENCOUNTER — Encounter (INDEPENDENT_AMBULATORY_CARE_PROVIDER_SITE_OTHER): Payer: Self-pay | Admitting: Family Medicine

## 2019-07-25 ENCOUNTER — Other Ambulatory Visit: Payer: Self-pay

## 2019-07-25 VITALS — BP 109/72 | HR 88 | Temp 98.1°F | Ht 64.0 in | Wt 239.0 lb

## 2019-07-25 DIAGNOSIS — I10 Essential (primary) hypertension: Secondary | ICD-10-CM | POA: Diagnosis not present

## 2019-07-25 DIAGNOSIS — Z6841 Body Mass Index (BMI) 40.0 and over, adult: Secondary | ICD-10-CM | POA: Diagnosis not present

## 2019-07-25 DIAGNOSIS — Z9189 Other specified personal risk factors, not elsewhere classified: Secondary | ICD-10-CM | POA: Diagnosis not present

## 2019-07-25 MED ORDER — SAXENDA 18 MG/3ML ~~LOC~~ SOPN: 3.0000 mg | PEN_INJECTOR | Freq: Every day | SUBCUTANEOUS | 0 refills | Status: DC

## 2019-07-25 MED ORDER — LISINOPRIL-HYDROCHLOROTHIAZIDE 20-25 MG PO TABS: 0.5000 | ORAL_TABLET | Freq: Every day | ORAL | 0 refills | Status: DC

## 2019-07-26 NOTE — Progress Notes (Signed)
Chief Complaint:   OBESITY Carly [name] is here to discuss her progress with her obesity treatment plan along with follow-up of her obesity related diagnoses. Carly [name] is on keeping a food journal and adhering to recommended goals of 1800-2000 calories and 115 grams of protein and states she is following her eating plan approximately 90% of the time. Carly [name] states she is walking for 20-30 minutes 3-4 times per week.  Today's visit was #: 61 Starting weight: 265 lbs Starting date: 11/14/2018 Today's weight: 239 lbs Today's date: 07/25/2019 Total lbs lost to date: 26 lbs Total lbs lost since last in-office visit: 1 lbs  Interim History: Carly [name] is now taking 1.2 mg of Saxenda subcutaneously daily. She is tolerating it well without side effects.  Subjective:   1. Essential hypertension Review: taking medications as instructed, no medication side effects noted, no chest pain on exertion, no dyspnea on exertion, no swelling of ankles.   BP Readings from Last 3 Encounters:  07/25/19 109/72  07/09/19 108/71  06/27/19 104/69   2. At risk for complication associated with hypotension The patient is at a higher than average risk of hypotension due to weight loss.  Assessment/Plan:   1. Essential hypertension Carly [name] is working on healthy weight loss and exercise to improve blood pressure control. We will watch for signs of hypotension as she continues her lifestyle modifications.  Will have her decrease her lisinopril by half due to lower blood pressure readings.  Orders - lisinopril-hydrochlorothiazide (ZESTORETIC) 20-25 MG tablet; Take 0.5 tablets by mouth daily.  Dispense: 15 tablet; Refill: 0  2. At risk for complication associated with hypotension Carly [name] was given approximately 15 minutes of education and counseling today to help avoid hypotension. We discussed risks of hypotension with weight loss and signs of hypotension such as feeling lightheaded or unsteady.  Repetitive spaced learning  was employed today to elicit superior memory formation and behavioral change.  3. Class 3 severe obesity with serious comorbidity and body mass index (BMI) of 40.0 to 44.9 in adult, unspecified obesity type (Carly [name])  Orders - Liraglutide -Weight Management (SAXENDA) 18 MG/3ML SOPN; Inject 0.5 mLs (3 mg total) into the skin daily.  Dispense: 5 pen; Refill: 0  Carly [name] is currently in the action stage of change. As such, her goal is to continue with weight loss efforts. She has agreed to keeping a food journal and adhering to recommended goals of 1800 calories and 115 grams of protein.   Exercise goals: For substantial health benefits, adults should do at least 150 minutes (2 hours and 30 minutes) a week of moderate-intensity, or 75 minutes (1 hour and 15 minutes) a week of vigorous-intensity aerobic physical activity, or an equivalent combination of moderate- and vigorous-intensity aerobic activity. Aerobic activity should be performed in episodes of at least 10 minutes, and preferably, it should be spread throughout the week.  Behavioral modification strategies: increasing lean protein intake and decreasing simple carbohydrates.  Carly [name] has agreed to follow-up with our clinic in 2-3 weeks. She was informed of the importance of frequent follow-up visits to maximize her success with intensive lifestyle modifications for her multiple health conditions.   Objective:   Blood pressure 109/72, pulse 88, temperature 98.1 F (36.7 C), temperature source Oral, height 5\' 4"  (1.626 m), weight 239 lb (108.4 kg), SpO2 98 %. Body mass index is 41.02 kg/m.  General: Cooperative, alert, well developed, in no acute distress. HEENT: Conjunctivae and lids unremarkable. Cardiovascular: Regular rhythm.  Lungs: Normal work of breathing. Neurologic:  No focal deficits.   Lab Results  Component Value Date   CREATININE 0.94 06/06/2019   BUN 20 06/06/2019   NA 141 06/06/2019   K 4.4 06/06/2019   CL 102 06/06/2019    CO2 22 06/06/2019   Lab Results  Component Value Date   ALT 49 (H) 06/06/2019   AST 30 06/06/2019   ALKPHOS 89 06/06/2019   BILITOT 0.3 06/06/2019   Lab Results  Component Value Date   HGBA1C 5.6 06/06/2019   HGBA1C 5.9 (H) 02/21/2019   HGBA1C 6.0 (H) 11/14/2018   Lab Results  Component Value Date   INSULIN 23.3 06/06/2019   INSULIN 43.3 (H) 11/14/2018   Lab Results  Component Value Date   TSH 1.080 11/14/2018   Lab Results  Component Value Date   CHOL 213 (H) 06/06/2019   HDL 39 (L) 06/06/2019   LDLCALC 145 (H) 06/06/2019   TRIG 161 (H) 06/06/2019   CHOLHDL 5.4 (H) 02/21/2019   Lab Results  Component Value Date   WBC 8.2 02/21/2019   HGB 14.1 02/21/2019   HCT 42.9 02/21/2019   MCV 87 02/21/2019   PLT 312 02/21/2019   Attestation Statements:   Reviewed by clinician on day of visit: allergies, medications, problem list, medical history, surgical history, family history, social history, and previous encounter notes.  I, Water quality scientist, CMA, am acting as transcriptionist for Briscoe Deutscher, DO  I have reviewed the above documentation for accuracy and completeness, and I agree with the above. Briscoe Deutscher, DO

## 2019-07-27 ENCOUNTER — Other Ambulatory Visit (INDEPENDENT_AMBULATORY_CARE_PROVIDER_SITE_OTHER): Payer: Self-pay | Admitting: Physician Assistant

## 2019-07-27 DIAGNOSIS — R7303 Prediabetes: Secondary | ICD-10-CM

## 2019-07-30 ENCOUNTER — Other Ambulatory Visit: Payer: Self-pay

## 2019-07-30 ENCOUNTER — Other Ambulatory Visit (INDEPENDENT_AMBULATORY_CARE_PROVIDER_SITE_OTHER): Payer: Self-pay | Admitting: Family Medicine

## 2019-07-30 DIAGNOSIS — Z6841 Body Mass Index (BMI) 40.0 and over, adult: Secondary | ICD-10-CM

## 2019-07-30 MED ORDER — ESOMEPRAZOLE MAGNESIUM 40 MG PO CPDR
40.0000 mg | DELAYED_RELEASE_CAPSULE | Freq: Every day | ORAL | 0 refills | Status: DC
Start: 2019-07-30 — End: 2019-10-11

## 2019-08-19 ENCOUNTER — Other Ambulatory Visit (INDEPENDENT_AMBULATORY_CARE_PROVIDER_SITE_OTHER): Payer: Self-pay | Admitting: Family Medicine

## 2019-08-19 DIAGNOSIS — Z6841 Body Mass Index (BMI) 40.0 and over, adult: Secondary | ICD-10-CM

## 2019-08-21 ENCOUNTER — Other Ambulatory Visit (INDEPENDENT_AMBULATORY_CARE_PROVIDER_SITE_OTHER): Payer: Self-pay | Admitting: Family Medicine

## 2019-08-21 DIAGNOSIS — I10 Essential (primary) hypertension: Secondary | ICD-10-CM

## 2019-08-22 ENCOUNTER — Other Ambulatory Visit: Payer: Self-pay

## 2019-08-22 ENCOUNTER — Ambulatory Visit (INDEPENDENT_AMBULATORY_CARE_PROVIDER_SITE_OTHER): Payer: BC Managed Care – PPO | Admitting: Family Medicine

## 2019-08-22 ENCOUNTER — Encounter (INDEPENDENT_AMBULATORY_CARE_PROVIDER_SITE_OTHER): Payer: Self-pay | Admitting: Family Medicine

## 2019-08-22 VITALS — BP 101/67 | HR 81 | Temp 98.5°F | Ht 64.0 in | Wt 242.0 lb

## 2019-08-22 DIAGNOSIS — Z9189 Other specified personal risk factors, not elsewhere classified: Secondary | ICD-10-CM

## 2019-08-22 DIAGNOSIS — E559 Vitamin D deficiency, unspecified: Secondary | ICD-10-CM

## 2019-08-22 DIAGNOSIS — Z6841 Body Mass Index (BMI) 40.0 and over, adult: Secondary | ICD-10-CM

## 2019-08-22 DIAGNOSIS — R0602 Shortness of breath: Secondary | ICD-10-CM

## 2019-08-22 DIAGNOSIS — I10 Essential (primary) hypertension: Secondary | ICD-10-CM

## 2019-08-22 MED ORDER — HYDROCHLOROTHIAZIDE 12.5 MG PO CAPS: 12.5000 mg | ORAL_CAPSULE | Freq: Every day | ORAL | 0 refills | Status: DC

## 2019-08-22 NOTE — Progress Notes (Signed)
Chief Complaint:   OBESITY Carly [name] is here to discuss her progress with her obesity treatment plan along with follow-up of her obesity related diagnoses. Carly [name] is on keeping a food journal and adhering to recommended goals of 1800 calories and 115 grams of protein and states she is following her eating plan approximately 80% of the time. Carly [name] states she is walking for 20-30 minutes 3-4 times per week.  Today's visit was #: 71 Starting weight: 265 lbs Starting date: 11/14/2018 Today's weight: 242 lbs Today's date: 08/22/2019 Total lbs lost to date: 23 lbs Total lbs lost since last in-office visit: 3 lbs  Interim History:  Carly [name] has gone up to 1.8 mg Saxenda and is tolerating it with no side effects.  She just got back from vacation.  She says she tried to monitor her calories but did indulge when she wanted.  Assessment/Plan:   1. Essential hypertension BP is still low despite recently decreasing medication. Cardiovascular ROS: negative for - chest pain, irregular heartbeat, palpitations or rapid heart rate. She will stop lisinopril and continue HCTZ.  Orders - hydrochlorothiazide (MICROZIDE) 12.5 MG capsule; Take 1 capsule (12.5 mg total) by mouth daily.  Dispense: 30 capsule; Refill: 0  2. Vitamin D deficiency Low Vitamin D level contributes to fatigue and are associated with obesity, breast, and colon cancer. She agrees to continue to take prescription Vitamin D @50 ,000 IU every week and will follow-up for routine testing of Vitamin D, at least 2-3 times per year to avoid over-replacement.  3. SOB (shortness of breath) on exertion Not optimized. New IC today. Orders and follow up as documented in patient record.  4. At risk for heart disease Carly [name] was given approximately 15 minutes of coronary artery disease prevention counseling today. She is 49 y.o. female and has risk factors for heart disease including obesity. We discussed intensive lifestyle modifications today with an  emphasis on specific weight loss instructions and strategies.   Repetitive spaced learning was employed today to elicit superior memory formation and behavioral change.  5. Class 3 severe obesity with serious comorbidity and body mass index (BMI) of 40.0 to 44.9 in adult, unspecified obesity type Carly [name]) Carly [name] is currently in the action stage of change. As such, her goal is to continue with weight loss efforts. She has agreed to keeping a food journal and adhering to recommended goals of 1500 calories and 95+ grams of protein.   Exercise goals: For substantial health benefits, adults should do at least 150 minutes (2 hours and 30 minutes) a week of moderate-intensity, or 75 minutes (1 hour and 15 minutes) a week of vigorous-intensity aerobic physical activity, or an equivalent combination of moderate- and vigorous-intensity aerobic activity. Aerobic activity should be performed in episodes of at least 10 minutes, and preferably, it should be spread throughout the week.  Behavioral modification strategies: increasing lean protein intake and increasing water intake.  Carly [name] has agreed to follow-up with our clinic in 2-3 weeks. She was informed of the importance of frequent follow-up visits to maximize her success with intensive lifestyle modifications for her multiple health conditions.   Objective:   Blood pressure 101/67, pulse 81, temperature 98.5 F (36.9 C), temperature source Oral, height 5\' 4"  (1.626 m), weight 242 lb (109.8 kg), SpO2 98 %. Body mass index is 41.54 kg/m.  General: Cooperative, alert, well developed, in no acute distress. HEENT: Conjunctivae and lids unremarkable. Cardiovascular: Regular rhythm.  Lungs: Normal work of breathing. Neurologic: No focal deficits.  Lab Results  Component Value Date   CREATININE 0.94 06/06/2019   BUN 20 06/06/2019   NA 141 06/06/2019   K 4.4 06/06/2019   CL 102 06/06/2019   CO2 22 06/06/2019   Lab Results  Component Value Date   ALT  49 (H) 06/06/2019   AST 30 06/06/2019   ALKPHOS 89 06/06/2019   BILITOT 0.3 06/06/2019   Lab Results  Component Value Date   HGBA1C 5.6 06/06/2019   HGBA1C 5.9 (H) 02/21/2019   HGBA1C 6.0 (H) 11/14/2018   Lab Results  Component Value Date   INSULIN 23.3 06/06/2019   INSULIN 43.3 (H) 11/14/2018   Lab Results  Component Value Date   TSH 1.080 11/14/2018   Lab Results  Component Value Date   CHOL 213 (H) 06/06/2019   HDL 39 (L) 06/06/2019   LDLCALC 145 (H) 06/06/2019   TRIG 161 (H) 06/06/2019   CHOLHDL 5.4 (H) 02/21/2019   Lab Results  Component Value Date   WBC 8.2 02/21/2019   HGB 14.1 02/21/2019   HCT 42.9 02/21/2019   MCV 87 02/21/2019   PLT 312 02/21/2019   Attestation Statements:   Reviewed by clinician on day of visit: allergies, medications, problem list, medical history, surgical history, family history, social history, and previous encounter notes.  I, Water quality scientist, CMA, am acting as transcriptionist for Briscoe Deutscher, DO  I have reviewed the above documentation for accuracy and completeness, and I agree with the above. Briscoe Deutscher, DO

## 2019-08-25 ENCOUNTER — Other Ambulatory Visit (INDEPENDENT_AMBULATORY_CARE_PROVIDER_SITE_OTHER): Payer: Self-pay | Admitting: Family Medicine

## 2019-08-25 DIAGNOSIS — R7303 Prediabetes: Secondary | ICD-10-CM

## 2019-08-27 ENCOUNTER — Encounter (INDEPENDENT_AMBULATORY_CARE_PROVIDER_SITE_OTHER): Payer: Self-pay

## 2019-08-27 NOTE — Telephone Encounter (Signed)
My chart message sent to pt.

## 2019-08-28 ENCOUNTER — Other Ambulatory Visit: Payer: Self-pay | Admitting: Nurse Practitioner

## 2019-08-28 ENCOUNTER — Telehealth: Payer: Self-pay | Admitting: Nurse Practitioner

## 2019-08-28 MED ORDER — AMITRIPTYLINE HCL 25 MG PO TABS: ORAL_TABLET | ORAL | 0 refills | Status: DC

## 2019-08-28 NOTE — Telephone Encounter (Signed)
Ok to refill Amitriptyline same dose # 30 days thx

## 2019-08-30 ENCOUNTER — Encounter: Payer: Self-pay | Admitting: Obstetrics and Gynecology

## 2019-08-30 ENCOUNTER — Telehealth: Payer: Self-pay | Admitting: Obstetrics and Gynecology

## 2019-08-30 NOTE — Telephone Encounter (Signed)
Carly [name] Clinical Pool When you switched my birth control I had a very heavy menstrual cycle for a couple months. I did not have one at all last month, and I'm on the 4th week for this month and haven't started as of today. I wasn't sure if this was normal and just wanted to let you know.   Thank you,   Carly [name]

## 2019-08-30 NOTE — Telephone Encounter (Signed)
Left message for pt to return call to triage RN. 

## 2019-08-30 NOTE — Telephone Encounter (Signed)
AEX 03/21/19 H/O leep, ~2010 Prediabetes, on metformin H/O HTN H/O cyclic epigastric abdominal pain and nausea, improved on Elavil.  Spoke with pt. Pt states having skipped the last 2 cycles in July and August. Pt states last cycle was 6/13 and was normal regular flow. Pt denies any heavy bleeding, clots, or cramping. Pt states did have cramping in July, but never did start bleeding.  Pt started taking Micronor after AEX appt, changed due to HTN.  Pt states had regular/monthly cycles until July. Pt denies any missed or skipped pills.   Pt advised to have follow up with Dr Talbert Nan to discuss next plan of care. Pt agreeable. Pt scheduled on 8/16 at 230pm. Pt verbalized understanding of date and time of appt.   Encounter closed.

## 2019-09-03 ENCOUNTER — Telehealth: Payer: Self-pay | Admitting: Obstetrics and Gynecology

## 2019-09-03 ENCOUNTER — Ambulatory Visit: Payer: Self-pay | Admitting: Obstetrics and Gynecology

## 2019-09-03 NOTE — Telephone Encounter (Signed)
Patient cancelled appointment for POP fu because of a family emergency. Will call back to reschedule.

## 2019-09-06 ENCOUNTER — Telehealth: Payer: Self-pay | Admitting: *Deleted

## 2019-09-06 NOTE — Telephone Encounter (Signed)
Mammo Digital Diagnostic Tomo Left  Impression Performed by [facility] Probable benign findings.   RECOMMENDATION:  Six-month follow-up mammogram left breast.   I have discussed the findings and recommendations with the patient.  If applicable, a reminder letter will be sent to the patient  regarding the next appointment.   BI-RADS CATEGORY 3: Probably benign.       Patient is in MMG hold for recall from screen on 08/02/19, left breast calcifications.  Left breast Dx MMG completed on 08/21/19 at [facility] -results in [place].   Removed from MMG hold.   6 month recall placed.   Routing to Dr. Talbert Nan for final review.

## 2019-09-13 ENCOUNTER — Ambulatory Visit (INDEPENDENT_AMBULATORY_CARE_PROVIDER_SITE_OTHER): Payer: BC Managed Care – PPO | Admitting: Physician Assistant

## 2019-09-13 ENCOUNTER — Encounter (INDEPENDENT_AMBULATORY_CARE_PROVIDER_SITE_OTHER): Payer: Self-pay

## 2019-09-13 ENCOUNTER — Other Ambulatory Visit: Payer: Self-pay

## 2019-09-14 ENCOUNTER — Other Ambulatory Visit: Payer: Self-pay

## 2019-09-14 MED ORDER — AMITRIPTYLINE HCL 25 MG PO TABS: ORAL_TABLET | ORAL | 0 refills | Status: DC

## 2019-09-15 ENCOUNTER — Other Ambulatory Visit (INDEPENDENT_AMBULATORY_CARE_PROVIDER_SITE_OTHER): Payer: Self-pay | Admitting: Family Medicine

## 2019-09-15 DIAGNOSIS — I10 Essential (primary) hypertension: Secondary | ICD-10-CM

## 2019-09-19 ENCOUNTER — Other Ambulatory Visit: Payer: Self-pay

## 2019-09-19 ENCOUNTER — Encounter (INDEPENDENT_AMBULATORY_CARE_PROVIDER_SITE_OTHER): Payer: Self-pay | Admitting: Physician Assistant

## 2019-09-19 ENCOUNTER — Ambulatory Visit (INDEPENDENT_AMBULATORY_CARE_PROVIDER_SITE_OTHER): Payer: BC Managed Care – PPO | Admitting: Physician Assistant

## 2019-09-19 VITALS — BP 123/78 | HR 87 | Temp 97.6°F | Ht 64.0 in | Wt 241.0 lb

## 2019-09-19 DIAGNOSIS — Z9189 Other specified personal risk factors, not elsewhere classified: Secondary | ICD-10-CM

## 2019-09-19 DIAGNOSIS — E7849 Other hyperlipidemia: Secondary | ICD-10-CM | POA: Diagnosis not present

## 2019-09-19 DIAGNOSIS — I10 Essential (primary) hypertension: Secondary | ICD-10-CM | POA: Diagnosis not present

## 2019-09-19 DIAGNOSIS — Z6841 Body Mass Index (BMI) 40.0 and over, adult: Secondary | ICD-10-CM

## 2019-09-19 MED ORDER — HYDROCHLOROTHIAZIDE 12.5 MG PO CAPS: 12.5000 mg | ORAL_CAPSULE | Freq: Every day | ORAL | 0 refills | Status: DC

## 2019-09-19 NOTE — Progress Notes (Signed)
Chief Complaint:   Carly [name] is here to discuss her progress with her Carly treatment plan along with follow-up of her Carly related diagnoses. [name] is keeping a food journal and adhering to recommended goals of 1500 calories and 95 grams of protein and states she is following her eating plan approximately 95% of the time. [name] states she is exercising 0 minutes 0 times per week.  Today's visit was #: 60 Starting weight: 265 lbs Starting date: 11/14/2018 Today's weight: 241 lbs Today's date: 09/19/2019 Total lbs lost to date: 24 Total lbs lost since last in-office visit: 1  Interim History: Carly [name] states that she has been crazy with work and she has not been able to walk as much. She is eating cereal with [employer] for breakfast; a sandwich for lunch, and meat and vegetables for dinner. She is snacking on [employer] bars and chips.  Subjective:   Essential hypertension. Blood pressure is controlled. Addilyne is on Microzide.  BP Readings from Last 3 Encounters:  09/19/19 123/78  08/22/19 101/67  07/25/19 109/72   Lab Results  Component Value Date   CREATININE 0.94 06/06/2019   CREATININE 1.03 (H) 02/21/2019   CREATININE 0.75 11/14/2018   Other hyperlipidemia. Carly [name] is on no medication. She has not been walking as much as normal recently due to the heat.   Lab Results  Component Value Date   CHOL 213 (H) 06/06/2019   HDL 39 (L) 06/06/2019   LDLCALC 145 (H) 06/06/2019   TRIG 161 (H) 06/06/2019   CHOLHDL 5.4 (H) 02/21/2019   Lab Results  Component Value Date   ALT 49 (H) 06/06/2019   AST 30 06/06/2019   ALKPHOS 89 06/06/2019   BILITOT 0.3 06/06/2019   The 10-year ASCVD risk score Mikey Bussing DC Jr., et al., 2013) is: 2.2%   Values used to calculate the score:     Age: 49 years     Sex: Female     Is Non-Hispanic African American: No     Diabetic: No     Tobacco smoker: No     Systolic Blood Pressure: 338 mmHg     Is BP treated: Yes     HDL  Cholesterol: 39 mg/dL     Total Cholesterol: 213 mg/dL  At risk for heart disease. Carly [name] is at a higher than average risk for cardiovascular disease due to Carly.   Assessment/Plan:   Essential hypertension. Charlissa is working on healthy weight loss and exercise to improve blood pressure control. We will watch for signs of hypotension as she continues her lifestyle modifications. Refill was given for hydrochlorothiazide (MICROZIDE) 12.5 MG capsule #30 with 0 refills.  Other hyperlipidemia. Cardiovascular risk and specific lipid/LDL goals reviewed.  We discussed several lifestyle modifications today and Linlee will continue to work on diet, increasing exercise, and weight loss efforts. Orders and follow up as documented in patient record.   Counseling Intensive lifestyle modifications are the first line treatment for this issue. . Dietary changes: Increase soluble fiber. Decrease simple carbohydrates. . Exercise changes: Moderate to vigorous-intensity aerobic activity 150 minutes per week if tolerated. . Lipid-lowering medications: see documented in medical record.  At risk for heart disease. Leanne was given approximately 15 minutes of coronary artery disease prevention counseling today. She is 49 y.o. female and has risk factors for heart disease including Carly. We discussed intensive lifestyle modifications today with an emphasis on specific weight loss instructions and strategies.   Repetitive spaced learning was employed  today to elicit superior memory formation and behavioral change.  Class 3 severe Carly with serious comorbidity and body mass index (BMI) of 40.0 to 44.9 in adult, unspecified Carly type (Bertie). Refill was given for Saxenda #5 pens with 0 refills.  Carly [name] is currently in the action stage of change. As such, her goal is to continue with weight loss efforts. She has agreed to keeping a food journal and adhering to recommended goals of 1500-1700 calories and 100 grams of  protein daily.   Exercise goals: For substantial health benefits, adults should do at least 150 minutes (2 hours and 30 minutes) a week of moderate-intensity, or 75 minutes (1 hour and 15 minutes) a week of vigorous-intensity aerobic physical activity, or an equivalent combination of moderate- and vigorous-intensity aerobic activity. Aerobic activity should be performed in episodes of at least 10 minutes, and preferably, it should be spread throughout the week.  Behavioral modification strategies: increasing lean protein intake and keeping a strict food journal.  Carly [name] has agreed to follow-up with our clinic fasting in 3 weeks. She was informed of the importance of frequent follow-up visits to maximize her success with intensive lifestyle modifications for her multiple health conditions.   Objective:   Blood pressure 123/78, pulse 87, temperature 97.6 F (36.4 C), temperature source Oral, height 5\' 4"  (1.626 m), weight 241 lb (109.3 kg), SpO2 97 %. Body mass index is 41.37 kg/m.  General: Cooperative, alert, well developed, in no acute distress. HEENT: Conjunctivae and lids unremarkable. Cardiovascular: Regular rhythm.  Lungs: Normal work of breathing. Neurologic: No focal deficits.   Lab Results  Component Value Date   CREATININE 0.94 06/06/2019   BUN 20 06/06/2019   NA 141 06/06/2019   K 4.4 06/06/2019   CL 102 06/06/2019   CO2 22 06/06/2019   Lab Results  Component Value Date   ALT 49 (H) 06/06/2019   AST 30 06/06/2019   ALKPHOS 89 06/06/2019   BILITOT 0.3 06/06/2019   Lab Results  Component Value Date   HGBA1C 5.6 06/06/2019   HGBA1C 5.9 (H) 02/21/2019   HGBA1C 6.0 (H) 11/14/2018   Lab Results  Component Value Date   INSULIN 23.3 06/06/2019   INSULIN 43.3 (H) 11/14/2018   Lab Results  Component Value Date   TSH 1.080 11/14/2018   Lab Results  Component Value Date   CHOL 213 (H) 06/06/2019   HDL 39 (L) 06/06/2019   LDLCALC 145 (H) 06/06/2019   TRIG 161 (H)  06/06/2019   CHOLHDL 5.4 (H) 02/21/2019   Lab Results  Component Value Date   WBC 8.2 02/21/2019   HGB 14.1 02/21/2019   HCT 42.9 02/21/2019   MCV 87 02/21/2019   PLT 312 02/21/2019   No results found for: IRON, TIBC, FERRITIN  Attestation Statements:   Reviewed by clinician on day of visit: allergies, medications, problem list, medical history, surgical history, family history, social history, and previous encounter notes.  IMichaelene Song, am acting as transcriptionist for Abby Potash, PA-C   I have reviewed the above documentation for accuracy and completeness, and I agree with the above. Abby Potash, PA-C

## 2019-09-20 ENCOUNTER — Other Ambulatory Visit (INDEPENDENT_AMBULATORY_CARE_PROVIDER_SITE_OTHER): Payer: Self-pay | Admitting: Family Medicine

## 2019-09-20 DIAGNOSIS — R7303 Prediabetes: Secondary | ICD-10-CM

## 2019-09-20 DIAGNOSIS — Z6841 Body Mass Index (BMI) 40.0 and over, adult: Secondary | ICD-10-CM

## 2019-09-25 ENCOUNTER — Encounter (INDEPENDENT_AMBULATORY_CARE_PROVIDER_SITE_OTHER): Payer: Self-pay

## 2019-10-07 ENCOUNTER — Other Ambulatory Visit (INDEPENDENT_AMBULATORY_CARE_PROVIDER_SITE_OTHER): Payer: Self-pay | Admitting: Family Medicine

## 2019-10-07 DIAGNOSIS — Z6841 Body Mass Index (BMI) 40.0 and over, adult: Secondary | ICD-10-CM

## 2019-10-08 ENCOUNTER — Encounter (INDEPENDENT_AMBULATORY_CARE_PROVIDER_SITE_OTHER): Payer: Self-pay

## 2019-10-08 NOTE — Telephone Encounter (Signed)
Message sent to pt.

## 2019-10-09 ENCOUNTER — Encounter (INDEPENDENT_AMBULATORY_CARE_PROVIDER_SITE_OTHER): Payer: Self-pay

## 2019-10-10 ENCOUNTER — Ambulatory Visit (INDEPENDENT_AMBULATORY_CARE_PROVIDER_SITE_OTHER): Payer: BC Managed Care – PPO | Admitting: Physician Assistant

## 2019-10-11 ENCOUNTER — Ambulatory Visit (INDEPENDENT_AMBULATORY_CARE_PROVIDER_SITE_OTHER): Payer: BC Managed Care – PPO | Admitting: Obstetrics and Gynecology

## 2019-10-11 ENCOUNTER — Encounter: Payer: Self-pay | Admitting: Nurse Practitioner

## 2019-10-11 ENCOUNTER — Ambulatory Visit: Payer: BC Managed Care – PPO | Admitting: Nurse Practitioner

## 2019-10-11 ENCOUNTER — Other Ambulatory Visit: Payer: Self-pay

## 2019-10-11 VITALS — BP 116/72 | HR 86 | Ht 64.0 in | Wt 249.1 lb

## 2019-10-11 DIAGNOSIS — K589 Irritable bowel syndrome without diarrhea: Secondary | ICD-10-CM

## 2019-10-11 DIAGNOSIS — R112 Nausea with vomiting, unspecified: Secondary | ICD-10-CM

## 2019-10-11 NOTE — Progress Notes (Signed)
I was late, patient needed to reschedule.

## 2019-10-11 NOTE — Patient Instructions (Signed)
If you are age 48 or older, your body mass index should be between 23-30. Your Body mass index is 42.76 kg/m. If this is out of the aforementioned range listed, please consider follow up with your Primary Care Provider.  If you are age 74 or younger, your body mass index should be between 19-25. Your Body mass index is 42.76 kg/m. If this is out of the aformentioned range listed, please consider follow up with your Primary Care Provider.   1. Benefiber 1 tablespoon daily. 2. Continue amitriptyline 3.Follow up with Dr Hilarie Fredrickson in 6 months-we will place a reminder in our computer system.  Dr Garth Schlatter schedule is not out for 03/2020, please call back 02/2020 to schedule your follow up appointment. [phone].  Due to recent changes in healthcare laws, you may see the results of your imaging and laboratory studies on MyChart before your provider has had a chance to review them.  We understand that in some cases there may be results that are confusing or concerning to you. Not all laboratory results come back in the same time frame and the provider may be waiting for multiple results in order to interpret others.  Please give [place] 48 hours in order for your provider to thoroughly review all the results before contacting the office for clarification of your results.   Thank you for choosing [facility] Noralyn Pick, CRNP  [phone]

## 2019-10-11 NOTE — Progress Notes (Signed)
10/11/2019 Carly [name] [id number] 05-Mar-1970   Chief Complaint: follow up episodes of N/V, epigastric pain and diarrhea   History of Present Illness: Carly [name] is a 49 year old female with a past medical history of depression hypertension, pre diabetes, sleep apnea, GERD, diverticulitis and colon polyps. Past cholecystectomy 2014. She is followed by Dr. Hilarie Fredrickson. I last saw the patient in office on 12/12/2018, at that time she was having less episodes of N/V, epigastric pain and diarrhea since starting Amitriptyline 25mg  two tabs at bed time.  She presents today for further follow-up.  Since her last office visit she denies having any episodes of N/V and  epigastric pain.  She has variable bowel pattern which she is not concerned about.  She mostly passes a normal formed bowel movement most days, however, sometimes she has a small amount of diarrhea for few days and skips 1 day then passes a soft then solid normal stool.  No rectal bleeding. She has intentionally lost 24lbs over the past year, followed by [facility].  History of colon polyps.  She underwent a colonoscopy 04/19/2016 which identified 1 tubular adenomatous and one benign polypoid polyp which were removed.  She is due for a repeat colonoscopy April 2023. Paternal aunt had colon cancer diagnosed late 64's.  History of hepatic steatosis.  Labs 06/08/2019 showed a mildly elevated ALT level of 49.  No other complaints today.  EGD 12/20/2017 by Dr. Hilarie Fredrickson: - The examined esophagus was normal. - The entire examined stomach was normal.  - Biopsies showed chronic inactive gastritis. No H. Pylori. - The examined duodenum was normal, biopsies not done.   Colonoscopy 04/19/2016 by Dr. Hilarie Fredrickson: - Two 3 to 4 mm polyps at the hepatic flexure and in the cecum, removed with a cold snare. Biopsies showed one tubular adenomatous polyp, one benign polypoid mucosa.  - Mild diverticulosis in the sigmoid colon, in  the descending colon, in the proximal transverse  colon and at the hepatic flexure. - Small internal hemorrhoids. - The examination was otherwise normal. -Recall colonoscopy in 5 years.    Abdominal MRI/MRCP 12/04/2017: Status post cholecystectomy. No intrahepatic or extrahepatic ductal dilatation. Common duct measures 5 mm. Mild hepatic steatosis. Otherwise negative MRI abdomen.  CBC Latest Ref Rng & Units 02/21/2019 11/14/2018 12/06/2017  WBC 3.4 - 10.8 x10E3/uL 8.2 11.2(H) 8.5  Hemoglobin 11.1 - 15.9 g/dL 14.1 13.2 13.2  Hematocrit 34.0 - 46.6 % 42.9 40.2 39.8  Platelets 150 - 450 x10E3/uL 312 306 265.0    CMP Latest Ref Rng & Units 06/06/2019 02/21/2019 11/14/2018  Glucose 65 - 99 mg/dL 90 85 101(H)  BUN 6 - 24 mg/dL 20 17 19   Creatinine 0.57 - 1.00 mg/dL 0.94 1.03(H) 0.75  Sodium 134 - 144 mmol/L 141 140 137  Potassium 3.5 - 5.2 mmol/L 4.4 4.5 4.7  Chloride 96 - 106 mmol/L 102 101 100  CO2 20 - 29 mmol/L 22 24 21   Calcium 8.7 - 10.2 mg/dL 9.9 9.9 9.2  Total Protein 6.0 - 8.5 g/dL 7.0 6.6 6.7  Total Bilirubin 0.0 - 1.2 mg/dL 0.3 0.3 <0.2  Alkaline Phos 48 - 121 IU/L 89 88 79  AST 0 - 40 IU/L 30 20 21   ALT 0 - 32 IU/L 49(H) 28 16     Current Outpatient Medications on File Prior to Visit  Medication Sig Dispense Refill  . amitriptyline (ELAVIL) 25 MG tablet TAKE 2 TABLETS BY MOUTH AT BEDTIME  60 tablet 0  . b complex vitamins tablet Take 1 tablet by mouth daily.    . BD PEN NEEDLE NANO 2ND GEN 32G X 4 MM MISC USE AS DIRECTED 100 each 0  . escitalopram (LEXAPRO) 10 MG tablet Take 10 mg by mouth daily.    Marland Kitchen esomeprazole (NEXIUM) 40 MG capsule TAKE 1 CAPSULE (40 MG TOTAL) BY MOUTH DAILY. 30 MINUTES BEFORE BREAKFAST 90 capsule 0  . hydrochlorothiazide (MICROZIDE) 12.5 MG capsule Take 1 capsule (12.5 mg total) by mouth daily. 30 capsule 0  . meclizine (ANTIVERT) 25 MG tablet Take 1 tablet by mouth as needed.    . metFORMIN (GLUCOPHAGE) 1000 MG tablet TAKE 1 TABLET (1,000 MG  TOTAL) BY MOUTH DAILY. 30 tablet 0  . norethindrone (MICRONOR) 0.35 MG tablet Take 1 tablet (0.35 mg total) by mouth daily. 3 Package 3  . SAXENDA 18 MG/3ML SOPN INJECT 0.5 MLS (3 MG TOTAL) INTO THE SKIN DAILY. 15 mL 0  . triamcinolone ointment (KENALOG) 0.1 % as needed.    Marland Kitchen VITAMIN D, CHOLECALCIFEROL, PO Take 1,000 Int'l Units/day by mouth daily.     Current Facility-Administered Medications on File Prior to Visit  Medication Dose Route Frequency Provider Last Rate Last Admin  . 0.9 %  sodium chloride infusion  500 mL Intravenous Once Pyrtle, Lajuan Lines, MD       Allergies  Allergen Reactions  . Cephalosporins Rash    Current Medications, Allergies, Past Medical History, Past Surgical History, Family History and Social History were reviewed in [employer] record.   Review of Systems:   Constitutional: Intentional weight loss.  Respiratory: Negative for shortness of breath.   Cardiovascular: Negative for chest pain, palpitations and leg swelling.  Gastrointestinal: See HPI.  Musculoskeletal: Negative for back pain or muscle aches.  Neurological: Negative for dizziness, headaches or paresthesias.    Physical Exam: There were no vitals taken for this visit.  BP 116/72   Pulse 86   Ht 5\' 4"  (1.626 m)   Wt 249 lb 2 oz (113 kg)   BMI 42.76 kg/m   General: Well developed 49 year old female in no acute distress. Head: Normocephalic and atraumatic. Eyes: No scleral icterus. Conjunctiva pink . Ears: Normal auditory acuity. Lungs: Clear throughout to auscultation. Heart: Regular rate and rhythm, no murmur. Abdomen: Soft, nontender and nondistended. No masses or hepatomegaly. Normal bowel sounds x 4 quadrants.  Rectal: Deferred.  Musculoskeletal: Symmetrical with no gross deformities. Extremities: No edema. Neurological: Alert oriented x 4. No focal deficits.  Psychological: Alert and cooperative. Normal mood and affect  Assessment and Recommendations:  1.  Episodic upper abdominal pain, nausea/vomiting +/- diarrhea well controlled on Amitriptyline.  -Continue Amitriptyline 25 mg 2 tabs at bedtime -Patient to call our office if symptoms recur -Follow up in office in 6 months and as needed  2. History of colon polyps -Next colonoscopy  due 04/2021  3. History of mild hepatis steatosis. Mildly elevated ALT.  -Labs scheduled 10/18/2019 -Continue follow-up with the [facility]  4. IBS symptoms -Benefiber 1 tablespoon daily to regulate BMs if tolerated

## 2019-10-16 ENCOUNTER — Ambulatory Visit: Payer: BC Managed Care – PPO | Admitting: Obstetrics and Gynecology

## 2019-10-16 ENCOUNTER — Encounter: Payer: Self-pay | Admitting: Obstetrics and Gynecology

## 2019-10-16 ENCOUNTER — Other Ambulatory Visit: Payer: Self-pay

## 2019-10-16 VITALS — BP 134/68 | HR 114 | Ht 64.0 in | Wt 248.0 lb

## 2019-10-16 DIAGNOSIS — Z793 Long term (current) use of hormonal contraceptives: Secondary | ICD-10-CM | POA: Diagnosis not present

## 2019-10-16 DIAGNOSIS — N912 Amenorrhea, unspecified: Secondary | ICD-10-CM | POA: Diagnosis not present

## 2019-10-16 NOTE — Progress Notes (Signed)
GYNECOLOGY  VISIT   HPI: 49 y.o.   Married White or Caucasian Not Hispanic or Latino  female   [phone] with No LMP recorded. (Menstrual status: Oral contraceptives).   here for menstrual changes. She states that she has not had a period since May. She took home pregnancy test that was negative.    She was switched from OCP's to POP in 3/21 secondary to her medical history. Since then she has been able to go off of anti-HTN medication.  She started the minipill ~04/19/19, she had a period in April and May, nothing since then. The cycles on the minipill lasted for a full week, up from 3-4 days on OCP's. She was wearing 3-4 pads a day (not saturated), some cramping.  Since stopping her cycle in May she has had some intermittent cramping and bloating, but no bleeding. No breast tenderness.  She had a negative pregnancy test in July.  She is loosing weight, going to the weight loss center.  No thyroid c/o, no galactorrhea.  No vasomotor symptoms.   GYNECOLOGIC HISTORY: No LMP recorded. (Menstrual status: Oral contraceptives). Contraception:POP Menopausal hormone therapy: none        OB History    Gravida  3   Para  2   Term  2   Preterm      AB  1   Living  2     SAB  1   TAB      Ectopic      Multiple      Live Births  2              Patient Active Problem List   Diagnosis Date Noted  . Prediabetes 03/21/2019  . Abdominal pain, epigastric 12/12/2018  . Nausea and vomiting 12/12/2018  . Diarrhea 12/12/2018  . History of repair of rotator cuff 11/29/2017  . OSA on CPAP 07/30/2014  . Hyperlipidemia 07/24/2012  . HTN (hypertension) 04/11/2012    Past Medical History:  Diagnosis Date  . Back pain   . Depression   . Diverticulitis   . Gastritis   . GERD (gastroesophageal reflux disease)   . Hepatic steatosis   . Hypertension   . Internal hemorrhoids   . Prediabetes 03/21/2019  . Sleep apnea   . Tubular adenoma of colon   . Vitamin D deficiency     Past  Surgical History:  Procedure Laterality Date  . CERVICAL BIOPSY  W/ LOOP ELECTRODE EXCISION     ~2010  . CHOLECYSTECTOMY    . MICROLARYNGOSCOPY WITH CO2 LASER AND EXCISION OF VOCAL CORD LESION    . SHOULDER ARTHROSCOPY      Current Outpatient Medications  Medication Sig Dispense Refill  . amitriptyline (ELAVIL) 25 MG tablet TAKE 2 TABLETS BY MOUTH AT BEDTIME 60 tablet 0  . b complex vitamins tablet Take 1 tablet by mouth daily.    . BD PEN NEEDLE NANO 2ND GEN 32G X 4 MM MISC USE AS DIRECTED 100 each 0  . escitalopram (LEXAPRO) 10 MG tablet Take 10 mg by mouth daily.    Marland Kitchen esomeprazole (NEXIUM) 40 MG capsule TAKE 1 CAPSULE (40 MG TOTAL) BY MOUTH DAILY. 30 MINUTES BEFORE BREAKFAST 90 capsule 0  . hydrochlorothiazide (MICROZIDE) 12.5 MG capsule Take 1 capsule (12.5 mg total) by mouth daily. 30 capsule 0  . meclizine (ANTIVERT) 25 MG tablet Take 1 tablet by mouth as needed.    . metFORMIN (GLUCOPHAGE) 1000 MG tablet TAKE 1 TABLET (1,000 MG TOTAL)  BY MOUTH DAILY. 30 tablet 0  . norethindrone (MICRONOR) 0.35 MG tablet Take 1 tablet (0.35 mg total) by mouth daily. 3 Package 3  . SAXENDA 18 MG/3ML SOPN INJECT 0.5 MLS (3 MG TOTAL) INTO THE SKIN DAILY. 15 mL 0  . triamcinolone ointment (KENALOG) 0.1 % as needed.    Marland Kitchen VITAMIN D, CHOLECALCIFEROL, PO Take 1,000 Int'l Units/day by mouth daily.     Current Facility-Administered Medications  Medication Dose Route Frequency Provider Last Rate Last Admin  . 0.9 %  sodium chloride infusion  500 mL Intravenous Once Pyrtle, Lajuan Lines, MD         ALLERGIES: Cephalosporins  Family History  Problem Relation Age of Onset  . Diabetes Father   . Lung cancer Father   . Bladder Cancer Father   . Heart disease Father   . Hypertension Father   . Hyperlipidemia Father   . Stroke Father   . Obesity Father   . Depression Mother   . Diabetes Maternal Grandmother   . Diabetes Paternal Grandmother   . Colon cancer Paternal Aunt 68  . Esophageal cancer Neg Hx   .  Rectal cancer Neg Hx   . Stomach cancer Neg Hx     Social History   Socioeconomic History  . Marital status: Married    Spouse name: Sondos Wolfman  . Number of children: 2  . Years of education: Not on file  . Highest education level: Not on file  Occupational History  . Occupation: Environmental consultant   Tobacco Use  . Smoking status: Never Smoker  . Smokeless tobacco: Never Used  Vaping Use  . Vaping Use: Never used  Substance and Sexual Activity  . Alcohol use: No  . Drug use: No  . Sexual activity: Yes    Partners: Male    Birth control/protection: Pill  Other Topics Concern  . Not on file  Social History Narrative  . Not on file   Social Determinants of Health   Financial Resource Strain:   . Difficulty of Paying Living Expenses: Not on file  Food Insecurity:   . Worried About Charity fundraiser in the Last Year: Not on file  . Ran Out of Food in the Last Year: Not on file  Transportation Needs:   . Lack of Transportation (Medical): Not on file  . Lack of Transportation (Non-Medical): Not on file  Physical Activity:   . Days of Exercise per Week: Not on file  . Minutes of Exercise per Session: Not on file  Stress:   . Feeling of Stress : Not on file  Social Connections:   . Frequency of Communication with Friends and Family: Not on file  . Frequency of Social Gatherings with Friends and Family: Not on file  . Attends Religious Services: Not on file  . Active Member of Clubs or Organizations: Not on file  . Attends Archivist Meetings: Not on file  . Marital Status: Not on file  Intimate Partner Violence:   . Fear of Current or Ex-Partner: Not on file  . Emotionally Abused: Not on file  . Physically Abused: Not on file  . Sexually Abused: Not on file    Review of Systems  All other systems reviewed and are negative.   PHYSICAL EXAMINATION:    BP 134/68   Pulse (!) 114   Ht 5\' 4"  (1.626 m)   Wt 248 lb (112.5 kg)   SpO2 96%   BMI 42.57  kg/m  General appearance: alert, cooperative and appears stated age Neck: no adenopathy, supple, symmetrical, trachea midline and thyroid normal to inspection and palpation   ASSESSMENT Patient has stopped cycling on the minipill, changed from OCP's to POP in April. She has had a negative pregnancy test    PLAN Discussed that it can be a normal side effect to develop amenorrhea or irregular bleeding on POP Patient reassured Call with any significant changes or any concerns.

## 2019-10-18 ENCOUNTER — Ambulatory Visit (INDEPENDENT_AMBULATORY_CARE_PROVIDER_SITE_OTHER): Payer: BC Managed Care – PPO | Admitting: Bariatrics

## 2019-10-18 ENCOUNTER — Other Ambulatory Visit: Payer: Self-pay

## 2019-10-18 ENCOUNTER — Encounter (INDEPENDENT_AMBULATORY_CARE_PROVIDER_SITE_OTHER): Payer: Self-pay | Admitting: Bariatrics

## 2019-10-18 VITALS — BP 128/85 | HR 88 | Temp 97.7°F | Ht 64.0 in | Wt 245.0 lb

## 2019-10-18 DIAGNOSIS — E8881 Metabolic syndrome: Secondary | ICD-10-CM

## 2019-10-18 DIAGNOSIS — I10 Essential (primary) hypertension: Secondary | ICD-10-CM | POA: Diagnosis not present

## 2019-10-18 DIAGNOSIS — Z9189 Other specified personal risk factors, not elsewhere classified: Secondary | ICD-10-CM | POA: Diagnosis not present

## 2019-10-18 DIAGNOSIS — E7849 Other hyperlipidemia: Secondary | ICD-10-CM

## 2019-10-18 DIAGNOSIS — Z6841 Body Mass Index (BMI) 40.0 and over, adult: Secondary | ICD-10-CM

## 2019-10-18 MED ORDER — BD PEN NEEDLE NANO 2ND GEN 32G X 4 MM MISC: 0 refills | Status: DC

## 2019-10-18 MED ORDER — HYDROCHLOROTHIAZIDE 12.5 MG PO CAPS: 12.5000 mg | ORAL_CAPSULE | Freq: Every day | ORAL | 0 refills | Status: DC

## 2019-10-18 NOTE — Progress Notes (Signed)
Chief Complaint:   OBESITY Carly [name] is here to discuss her progress with her obesity treatment plan along with follow-up of her obesity related diagnoses. Carly [name] is on keeping a food journal and adhering to recommended goals of 1500-1700 calories and 100 grams of protein daily and states she is following her eating plan approximately 90% of the time. Carly [name] states she is walking for 10-15 minutes 6 times per week.  Today's visit was #: 51 Starting weight: 265 lbs Starting date: 11/14/2018 Today's weight: 245 lbs Today's date: 10/18/2019 Total lbs lost to date: 20 Total lbs lost since last in-office visit: 0  Interim History: Carly [name] is up 4 lbs but she has done well overall. She states that she is unsure why her weight is up. She is taking [place] and is doing well.  Subjective:   1. Other hyperlipidemia Palmer is not on medications currently, and she denies myalgias.  2. Insulin resistance Carly [name] is on metformin and last insulin level was 23.3.  3. Essential hypertension Carly [name]'s blood pressure is 128/85 today.  4. At risk for diabetes mellitus Carly [name] is at higher than average risk for developing diabetes due to her obesity.   Assessment/Plan:   1. Other hyperlipidemia Cardiovascular risk and specific lipid/LDL goals reviewed. We discussed several lifestyle modifications today and Siara will continue to work on diet, exercise and weight loss efforts. We will check labs today. Orders and follow up as documented in patient record.   Counseling Intensive lifestyle modifications are the first line treatment for this issue. . Dietary changes: Increase soluble fiber. Decrease simple carbohydrates. . Exercise changes: Moderate to vigorous-intensity aerobic activity 150 minutes per week if tolerated. . Lipid-lowering medications: see documented in medical record.  - Lipid Panel With LDL/HDL Ratio - TSH+T4F+T3Free - Comprehensive metabolic panel  2. Insulin resistance Carly [name] will  continue metformin, and will continue to work on weight loss, exercise, and decreasing simple carbohydrates to help decrease the risk of diabetes. We will check labs today. Carly [name] agreed to follow-up with [place] as directed to closely monitor her progress.  - Insulin, random - Hemoglobin A1c - TSH+T4F+T3Free  3. Essential hypertension Retal is working on healthy weight loss and exercise to improve blood pressure control. We will watch for signs of hypotension as she continues her lifestyle modifications. We will check labs today, and we will refill hydrochlorothiazide for 1 month.  - hydrochlorothiazide (MICROZIDE) 12.5 MG capsule; Take 1 capsule (12.5 mg total) by mouth daily.  Dispense: 30 capsule; Refill: 0 - TSH+T4F+T3Free - Comprehensive metabolic panel  4. At risk for diabetes mellitus Carly [name] was given approximately 15 minutes of diabetes education and counseling today. We discussed intensive lifestyle modifications today with an emphasis on weight loss as well as increasing exercise and decreasing simple carbohydrates in her diet. We also reviewed medication options with an emphasis on risk versus benefit of those discussed.   Repetitive spaced learning was employed today to elicit superior memory formation and behavioral change.  5. Class 3 severe obesity with serious comorbidity and body mass index (BMI) of 40.0 to 44.9 in adult, unspecified obesity type [facility]) Carly [name] is currently in the action stage of change. As such, her goal is to continue with weight loss efforts. She has agreed to keeping a food journal and adhering to recommended goals of 1500-1700 calories and 100 grams of protein daily.   We discussed various medication options to help Letoya with her weight loss efforts and we both agreed to we will refill  insulin needles #100 with no refill.  - Insulin Pen Needle (BD PEN NEEDLE NANO 2ND GEN) 32G X 4 MM MISC; USE AS DIRECTED  Dispense: 100 each; Refill: 0  Exercise goals: As  is.  Behavioral modification strategies: increasing lean protein intake, decreasing simple carbohydrates, increasing vegetables, increasing water intake, decreasing eating out, no skipping meals, meal planning and cooking strategies, keeping healthy foods in the home and planning for success.  Carly [name] has agreed to follow-up with our clinic in 2 to 3 weeks with Abby Potash, PA-C or Dr. Juleen China. She was informed of the importance of frequent follow-up visits to maximize her success with intensive lifestyle modifications for her multiple health conditions.   Objective:   Blood pressure 128/85, pulse 88, temperature 97.7 F (36.5 C), height 5\' 4"  (1.626 m), weight 245 lb (111.1 kg), SpO2 98 %. Body mass index is 42.05 kg/m.  General: Cooperative, alert, well developed, in no acute distress. HEENT: Conjunctivae and lids unremarkable. Cardiovascular: Regular rhythm.  Lungs: Normal work of breathing. Neurologic: No focal deficits.   Lab Results  Component Value Date   CREATININE 0.94 06/06/2019   BUN 20 06/06/2019   NA 141 06/06/2019   K 4.4 06/06/2019   CL 102 06/06/2019   CO2 22 06/06/2019   Lab Results  Component Value Date   ALT 49 (H) 06/06/2019   AST 30 06/06/2019   ALKPHOS 89 06/06/2019   BILITOT 0.3 06/06/2019   Lab Results  Component Value Date   HGBA1C 5.6 06/06/2019   HGBA1C 5.9 (H) 02/21/2019   HGBA1C 6.0 (H) 11/14/2018   Lab Results  Component Value Date   INSULIN 23.3 06/06/2019   INSULIN 43.3 (H) 11/14/2018   Lab Results  Component Value Date   TSH 1.080 11/14/2018   Lab Results  Component Value Date   CHOL 213 (H) 06/06/2019   HDL 39 (L) 06/06/2019   LDLCALC 145 (H) 06/06/2019   TRIG 161 (H) 06/06/2019   CHOLHDL 5.4 (H) 02/21/2019   Lab Results  Component Value Date   WBC 8.2 02/21/2019   HGB 14.1 02/21/2019   HCT 42.9 02/21/2019   MCV 87 02/21/2019   PLT 312 02/21/2019   No results found for: IRON, TIBC, FERRITIN  Attestation Statements:    Reviewed by clinician on day of visit: allergies, medications, problem list, medical history, surgical history, family history, social history, and previous encounter notes.   Wilhemena Durie, am acting as Location manager for [employer], DO.  I have reviewed the above documentation for accuracy and completeness, and I agree with the above. Jearld Lesch, DO

## 2019-10-19 LAB — COMPREHENSIVE METABOLIC PANEL
ALT: 25 IU/L (ref 0–32)
AST: 17 IU/L (ref 0–40)
Albumin/Globulin Ratio: 1.7 (ref 1.2–2.2)
Albumin: 4.3 g/dL (ref 3.8–4.8)
Alkaline Phosphatase: 103 IU/L (ref 44–121)
BUN/Creatinine Ratio: 23 (ref 9–23)
BUN: 18 mg/dL (ref 6–24)
Bilirubin Total: <0.2 mg/dL (ref 0.0–1.2)
CO2: 24 mmol/L (ref 20–29)
Calcium: 9.4 mg/dL (ref 8.7–10.2)
Chloride: 102 mmol/L (ref 96–106)
Creatinine, Ser: 0.8 mg/dL (ref 0.57–1.00)
GFR calc Af Amer: 102 mL/min/{1.73_m2} (ref >59)
GFR calc non Af Amer: 88 mL/min/{1.73_m2} (ref >59)
Globulin, Total: 2.6 g/dL (ref 1.5–4.5)
Glucose: 90 mg/dL (ref 65–99)
Potassium: 4.1 mmol/L (ref 3.5–5.2)
Sodium: 141 mmol/L (ref 134–144)
Total Protein: 6.9 g/dL (ref 6.0–8.5)

## 2019-10-19 LAB — LIPID PANEL WITH LDL/HDL RATIO
Cholesterol, Total: 193 mg/dL (ref 100–199)
HDL: 33 mg/dL — ABNORMAL LOW (ref >39)
LDL Chol Calc (NIH): 131 mg/dL — ABNORMAL HIGH (ref 0–99)
LDL/HDL Ratio: 4 ratio — ABNORMAL HIGH (ref 0.0–3.2)
Triglycerides: 159 mg/dL — ABNORMAL HIGH (ref 0–149)
VLDL Cholesterol Cal: 29 mg/dL (ref 5–40)

## 2019-10-19 LAB — TSH+T4F+T3FREE
Free T4: 0.87 ng/dL (ref 0.82–1.77)
T3, Free: 2.6 pg/mL (ref 2.0–4.4)
TSH: 1.15 u[IU]/mL (ref 0.450–4.500)

## 2019-10-19 LAB — HEMOGLOBIN A1C
Est. average glucose Bld gHb Est-mCnc: 114 mg/dL
Hgb A1c MFr Bld: 5.6 % (ref 4.8–5.6)

## 2019-10-19 LAB — INSULIN, RANDOM: INSULIN: 38.6 u[IU]/mL — ABNORMAL HIGH (ref 2.6–24.9)

## 2019-10-19 NOTE — Progress Notes (Signed)
Addendum: Reviewed and agree with assessment and management plan. Ralphie Lovelady M, MD  

## 2019-10-21 ENCOUNTER — Other Ambulatory Visit: Payer: Self-pay | Admitting: Internal Medicine

## 2019-10-23 ENCOUNTER — Other Ambulatory Visit (INDEPENDENT_AMBULATORY_CARE_PROVIDER_SITE_OTHER): Payer: Self-pay | Admitting: Physician Assistant

## 2019-10-23 DIAGNOSIS — I10 Essential (primary) hypertension: Secondary | ICD-10-CM

## 2019-10-25 ENCOUNTER — Encounter (INDEPENDENT_AMBULATORY_CARE_PROVIDER_SITE_OTHER): Payer: Self-pay | Admitting: Physician Assistant

## 2019-10-28 ENCOUNTER — Other Ambulatory Visit (INDEPENDENT_AMBULATORY_CARE_PROVIDER_SITE_OTHER): Payer: Self-pay | Admitting: Physician Assistant

## 2019-10-29 ENCOUNTER — Other Ambulatory Visit (INDEPENDENT_AMBULATORY_CARE_PROVIDER_SITE_OTHER): Payer: Self-pay | Admitting: Family Medicine

## 2019-10-29 DIAGNOSIS — R7303 Prediabetes: Secondary | ICD-10-CM

## 2019-11-01 ENCOUNTER — Other Ambulatory Visit: Payer: Self-pay | Admitting: Nurse Practitioner

## 2019-11-01 NOTE — Telephone Encounter (Signed)
Please review

## 2019-11-07 ENCOUNTER — Encounter (INDEPENDENT_AMBULATORY_CARE_PROVIDER_SITE_OTHER): Payer: Self-pay | Admitting: Physician Assistant

## 2019-11-07 ENCOUNTER — Other Ambulatory Visit: Payer: Self-pay

## 2019-11-07 ENCOUNTER — Ambulatory Visit (INDEPENDENT_AMBULATORY_CARE_PROVIDER_SITE_OTHER): Payer: BC Managed Care – PPO | Admitting: Physician Assistant

## 2019-11-07 VITALS — BP 141/84 | HR 71 | Temp 98.0°F | Ht 64.0 in | Wt 244.0 lb

## 2019-11-07 DIAGNOSIS — Z6841 Body Mass Index (BMI) 40.0 and over, adult: Secondary | ICD-10-CM | POA: Diagnosis not present

## 2019-11-07 DIAGNOSIS — Z9189 Other specified personal risk factors, not elsewhere classified: Secondary | ICD-10-CM | POA: Diagnosis not present

## 2019-11-07 DIAGNOSIS — E7849 Other hyperlipidemia: Secondary | ICD-10-CM

## 2019-11-07 DIAGNOSIS — R7303 Prediabetes: Secondary | ICD-10-CM

## 2019-11-07 MED ORDER — METFORMIN HCL 1000 MG PO TABS: 1000.0000 mg | ORAL_TABLET | Freq: Every day | ORAL | 0 refills | Status: DC

## 2019-11-12 NOTE — Progress Notes (Signed)
Chief Complaint:   OBESITY Carly [name] is here to discuss her progress with her obesity treatment plan along with follow-up of her obesity related diagnoses. Carly [name] is keeping a food journal and adhering to recommended goals of 1500-1700 calories and 100 grams of protein and states she is following her eating plan approximately 85% of the time. Carly [name] states she is exercising 0 minutes 0 times per week.  Today's visit was #: 20 Starting weight: 265 lbs Starting date: 11/14/2018 Today's weight: 244 lbs Today's date: 11/07/2019 Total lbs lost to date: 21 Total lbs lost since last in-office visit: 1  Interim History: Carly [name] states that she has been very stressed recently at work. She is either not eating enough or eating the wrong things. She is currently taking 1.8 mg of Saxenda.  Subjective:   Prediabetes. Carly [name] has a diagnosis of prediabetes based on her elevated HgA1c and was informed this puts her at greater risk of developing diabetes. She continues to work on diet and exercise to decrease her risk of diabetes. She denies nausea or hypoglycemia. Carly [name] is on metformin, which she is tolerating well. Last A1c 5.6; insulin level worsening.  Lab Results  Component Value Date   HGBA1C 5.6 10/18/2019   Lab Results  Component Value Date   INSULIN 38.6 (H) 10/18/2019   INSULIN 23.3 06/06/2019   INSULIN 43.3 (H) 11/14/2018   Other hyperlipidemia. Carly [name] is on no medications currently. She did not exercise much the last few weeks.   Lab Results  Component Value Date   CHOL 193 10/18/2019   HDL 33 (L) 10/18/2019   LDLCALC 131 (H) 10/18/2019   TRIG 159 (H) 10/18/2019   CHOLHDL 5.4 (H) 02/21/2019   Lab Results  Component Value Date   ALT 25 10/18/2019   AST 17 10/18/2019   ALKPHOS 103 10/18/2019   BILITOT <0.2 10/18/2019   The 10-year ASCVD risk score Mikey Bussing DC Jr., et al., 2013) is: 3.3%   Values used to calculate the score:     Age: 49 years     Sex: Female     Is  Non-Hispanic African American: No     Diabetic: No     Tobacco smoker: No     Systolic Blood Pressure: 761 mmHg     Is BP treated: Yes     HDL Cholesterol: 33 mg/dL     Total Cholesterol: 193 mg/dL  At increased risk of exposure to COVID-19 virus. The patient is at higher risk of COVID-19 infection due to higher infection rates locally.  Assessment/Plan:   Prediabetes. [name] will continue to work on weight loss, exercise, and decreasing simple carbohydrates to help decrease the risk of diabetes. Refill was given for metFORMIN (GLUCOPHAGE) 1000 MG tablet #30 with 0 refills.  Other hyperlipidemia. Cardiovascular risk and specific lipid/LDL goals reviewed.  We discussed several lifestyle modifications today and [name] will continue to work on diet, exercise and weight loss efforts. Orders and follow up as documented in patient record. She will increase exercise and decrease simple carbs.  Counseling Intensive lifestyle modifications are the first line treatment for this issue. . Dietary changes: Increase soluble fiber. Decrease simple carbohydrates. . Exercise changes: Moderate to vigorous-intensity aerobic activity 150 minutes per week if tolerated. . Lipid-lowering medications: see documented in medical record.  At increased risk of exposure to COVID-19 virus. Carly [name] was given approximately 15 minutes of COVID prevention counseling today Counseling  COVID-19 is a respiratory infection that is caused by a  virus. It can cause serious infections, such as pneumonia, acute respiratory distress syndrome, acute respiratory failure, or sepsis.  You are more likely to develop a serious illness if you are 62 years of age or older, have a weak immune system, live in a nursing home, have chronic disease, or have obesity.  Get vaccinated as soon as they are available to you.  For our most current information, please visit [email or web address].  Wash your hands often with soap and water for 20  seconds. If soap and water are not available, use alcohol-based hand sanitizer.  Wear a face mask. Make sure your mask covers your nose and mouth.  Maintain at least 6 feet distance from others when in public.  Get help right away if  You have trouble breathing, chest pain, confusion, or other concerning symptoms.  Repetitive spaced learning was employed today to elicit superior memory formation and behavioral change.  Class 3 severe obesity with serious comorbidity and body mass index (BMI) of 40.0 to 44.9 in adult, unspecified obesity type ([place]). Carly [name] will increase Saxenda to 2.4 mg (no prescription).  Carly [name] is currently in the action stage of change. As such, her goal is to continue with weight loss efforts. She has agreed to keeping a food journal and adhering to recommended goals of 1500-1700 calories and 100 grams of protein daily.   Exercise goals: For substantial health benefits, adults should do at least 150 minutes (2 hours and 30 minutes) a week of moderate-intensity, or 75 minutes (1 hour and 15 minutes) a week of vigorous-intensity aerobic physical activity, or an equivalent combination of moderate- and vigorous-intensity aerobic activity. Aerobic activity should be performed in episodes of at least 10 minutes, and preferably, it should be spread throughout the week.  Behavioral modification strategies: increasing lean protein intake, no skipping meals and keeping a strict food journal.  Carly [name] has agreed to follow-up with our clinic in 3 weeks. She was informed of the importance of frequent follow-up visits to maximize her success with intensive lifestyle modifications for her multiple health conditions.   Objective:   Blood pressure (!) 141/84, pulse 71, temperature 98 F (36.7 C), height 5\' 4"  (1.626 m), weight 244 lb (110.7 kg), SpO2 98 %. Body mass index is 41.88 kg/m.  General: Cooperative, alert, well developed, in no acute distress. HEENT: Conjunctivae and lids  unremarkable. Cardiovascular: Regular rhythm.  Lungs: Normal work of breathing. Neurologic: No focal deficits.   Lab Results  Component Value Date   CREATININE 0.80 10/18/2019   BUN 18 10/18/2019   NA 141 10/18/2019   K 4.1 10/18/2019   CL 102 10/18/2019   CO2 24 10/18/2019   Lab Results  Component Value Date   ALT 25 10/18/2019   AST 17 10/18/2019   ALKPHOS 103 10/18/2019   BILITOT <0.2 10/18/2019   Lab Results  Component Value Date   HGBA1C 5.6 10/18/2019   HGBA1C 5.6 06/06/2019   HGBA1C 5.9 (H) 02/21/2019   HGBA1C 6.0 (H) 11/14/2018   Lab Results  Component Value Date   INSULIN 38.6 (H) 10/18/2019   INSULIN 23.3 06/06/2019   INSULIN 43.3 (H) 11/14/2018   Lab Results  Component Value Date   TSH 1.150 10/18/2019   Lab Results  Component Value Date   CHOL 193 10/18/2019   HDL 33 (L) 10/18/2019   LDLCALC 131 (H) 10/18/2019   TRIG 159 (H) 10/18/2019   CHOLHDL 5.4 (H) 02/21/2019   Lab Results  Component Value Date   WBC 8.2  02/21/2019   HGB 14.1 02/21/2019   HCT 42.9 02/21/2019   MCV 87 02/21/2019   PLT 312 02/21/2019   No results found for: IRON, TIBC, FERRITIN  Attestation Statements:   Reviewed by clinician on day of visit: allergies, medications, problem list, medical history, surgical history, family history, social history, and previous encounter notes.  IMichaelene Song, am acting as transcriptionist for Abby Potash, PA-C   I have reviewed the above documentation for accuracy and completeness, and I agree with the above. Abby Potash, PA-C

## 2019-11-23 ENCOUNTER — Other Ambulatory Visit (INDEPENDENT_AMBULATORY_CARE_PROVIDER_SITE_OTHER): Payer: Self-pay | Admitting: Bariatrics

## 2019-11-23 DIAGNOSIS — I10 Essential (primary) hypertension: Secondary | ICD-10-CM

## 2019-11-26 ENCOUNTER — Ambulatory Visit (INDEPENDENT_AMBULATORY_CARE_PROVIDER_SITE_OTHER): Payer: BC Managed Care – PPO | Admitting: Adult Health

## 2019-11-26 NOTE — Telephone Encounter (Signed)
Ok to send    thanks

## 2019-11-26 NOTE — Telephone Encounter (Signed)
This patient was last seen by Abby Potash, PA-C, and currently has an upcoming appt scheduled on 11/29/19 with Dr. Owens Shark.

## 2019-11-26 NOTE — Telephone Encounter (Signed)
Would you like for me to refill rx?

## 2019-11-27 ENCOUNTER — Other Ambulatory Visit (INDEPENDENT_AMBULATORY_CARE_PROVIDER_SITE_OTHER): Payer: Self-pay

## 2019-11-27 DIAGNOSIS — I10 Essential (primary) hypertension: Secondary | ICD-10-CM

## 2019-11-27 MED ORDER — HYDROCHLOROTHIAZIDE 12.5 MG PO CAPS: ORAL_CAPSULE | ORAL | 0 refills | Status: DC

## 2019-11-29 ENCOUNTER — Other Ambulatory Visit: Payer: Self-pay

## 2019-11-29 ENCOUNTER — Ambulatory Visit (INDEPENDENT_AMBULATORY_CARE_PROVIDER_SITE_OTHER): Payer: BC Managed Care – PPO | Admitting: Bariatrics

## 2019-11-29 ENCOUNTER — Encounter (INDEPENDENT_AMBULATORY_CARE_PROVIDER_SITE_OTHER): Payer: Self-pay | Admitting: Bariatrics

## 2019-11-29 VITALS — BP 136/81 | HR 88 | Temp 97.9°F | Ht 64.0 in | Wt 249.0 lb

## 2019-11-29 DIAGNOSIS — Z9189 Other specified personal risk factors, not elsewhere classified: Secondary | ICD-10-CM | POA: Diagnosis not present

## 2019-11-29 DIAGNOSIS — K5909 Other constipation: Secondary | ICD-10-CM | POA: Diagnosis not present

## 2019-11-29 DIAGNOSIS — I10 Essential (primary) hypertension: Secondary | ICD-10-CM | POA: Diagnosis not present

## 2019-11-29 DIAGNOSIS — R7303 Prediabetes: Secondary | ICD-10-CM | POA: Diagnosis not present

## 2019-11-29 DIAGNOSIS — Z6841 Body Mass Index (BMI) 40.0 and over, adult: Secondary | ICD-10-CM

## 2019-11-29 MED ORDER — SAXENDA 18 MG/3ML ~~LOC~~ SOPN: 3.0000 mg | PEN_INJECTOR | Freq: Every day | SUBCUTANEOUS | 0 refills | Status: DC

## 2019-11-29 NOTE — Progress Notes (Signed)
Chief Complaint:   OBESITY Carly [name] is here to discuss her progress with her obesity treatment plan along with follow-up of her obesity related diagnoses. Carly [name] is keeping a food journal and adhering to recommended goals of 1600 calories and 100 grams of protein and states she is following her eating plan approximately 85% of the time. Carly [name] states she is walking 20 minutes 3-4 times per week.  Today's visit was #: 21 Starting weight: 265 lbs Starting date: 11/14/2018 Today's weight: 249 lbs Today's date: 11/29/2019 Total lbs lost to date: 16 Total lbs lost since last in-office visit: 0  Interim History: Carly [name] is up 5 lbs. She is eating out bread and carbohydrates. She is using Saxenda at 2.4 mg.  Subjective:   Essential hypertension. Carly [name] is taking Microzide. Blood pressure is reasonably well controlled.  BP Readings from Last 3 Encounters:  11/07/19 (!) 141/84  10/18/19 128/85  10/16/19 134/68   Lab Results  Component Value Date   CREATININE 0.80 10/18/2019   CREATININE 0.94 06/06/2019   CREATININE 1.03 (H) 02/21/2019   Prediabetes. Carly [name] has a diagnosis of prediabetes based on her elevated HgA1c and was informed this puts her at greater risk of developing diabetes. She continues to work on diet and exercise to decrease her risk of diabetes. She denies nausea or hypoglycemia. Carly [name] is taking Glucophage.  Lab Results  Component Value Date   HGBA1C 5.6 10/18/2019   Lab Results  Component Value Date   INSULIN 38.6 (H) 10/18/2019   INSULIN 23.3 06/06/2019   INSULIN 43.3 (H) 11/14/2018   Other constipation. Carly [name] is taking  Benefiber and has increased her water intake.  At risk for diabetes mellitus. Carly [name] is at higher than average risk for developing diabetes due to obesity.   Assessment/Plan:   Essential hypertension. Carly [name] is working on healthy weight loss and exercise to improve blood pressure control. We will watch for signs of hypotension as she  continues her lifestyle modifications. She will continue her medication as directed.   Prediabetes. Carly [name] will continue to work on weight loss, exercise, and decreasing simple carbohydrates to help decrease the risk of diabetes. She will continue her medication as directed.   Other constipation. Carly [name] was informed that a decrease in bowel movement frequency is normal while losing weight, but stools should not be hard or painful. Orders and follow up as documented in patient record. She was instructed to add Miralax and stool softeners.  Counseling Getting to Good Bowel Health: Your goal is to have one soft bowel movement each day. Drink at least 8 glasses of water each day. Eat plenty of fiber (goal is over 25 grams each day). It is best to get most of your fiber from dietary sources which includes leafy green vegetables, fresh fruit, and whole grains. You may need to add fiber with the help of OTC fiber supplements. These include Metamucil, Citrucel, and Flaxseed. If you are still having trouble, try adding Miralax or Magnesium Citrate. If all of these changes do not work, Cabin crew.  At risk for diabetes mellitus. Carly [name] was given approximately 15 minutes of diabetes education and counseling today. We discussed intensive lifestyle modifications today with an emphasis on weight loss as well as increasing exercise and decreasing simple carbohydrates in her diet. We also reviewed medication options with an emphasis on risk versus benefit of those discussed.   Repetitive spaced learning was employed today to elicit superior memory formation and behavioral change.  Class  3 severe obesity with serious comorbidity and body mass index (BMI) of 40.0 to 44.9 in adult, unspecified obesity type ([place]). Prescription was given for Liraglutide - Weight Management (SAXENDA) 18 MG/3ML SOPN 3 mg total into skin daily.  Carly [name] is currently in the action stage of change. As such, her goal is to continue with  weight loss efforts. She has agreed to keeping a food journal and adhering to recommended goals of 1600 calories and 100 grams of protein.   She will work on meal planning.  Exercise goals: All adults should avoid inactivity. Some physical activity is better than none, and adults who participate in any amount of physical activity gain some health benefits.  Behavioral modification strategies: increasing lean protein intake, decreasing simple carbohydrates, increasing vegetables, increasing water intake, decreasing eating out, no skipping meals, meal planning and cooking strategies, keeping healthy foods in the home and planning for success.  Carly [name] has agreed to follow-up with our clinic in 2-3 weeks. She was informed of the importance of frequent follow-up visits to maximize her success with intensive lifestyle modifications for her multiple health conditions.   Objective:   Pulse 88, temperature 97.9 F (36.6 C), height 5\' 4"  (1.626 m), weight 249 lb (112.9 kg), SpO2 97 %. Body mass index is 42.74 kg/m.  General: Cooperative, alert, well developed, in no acute distress. HEENT: Conjunctivae and lids unremarkable. Cardiovascular: Regular rhythm.  Lungs: Normal work of breathing. Neurologic: No focal deficits.   Lab Results  Component Value Date   CREATININE 0.80 10/18/2019   BUN 18 10/18/2019   NA 141 10/18/2019   K 4.1 10/18/2019   CL 102 10/18/2019   CO2 24 10/18/2019   Lab Results  Component Value Date   ALT 25 10/18/2019   AST 17 10/18/2019   ALKPHOS 103 10/18/2019   BILITOT <0.2 10/18/2019   Lab Results  Component Value Date   HGBA1C 5.6 10/18/2019   HGBA1C 5.6 06/06/2019   HGBA1C 5.9 (H) 02/21/2019   HGBA1C 6.0 (H) 11/14/2018   Lab Results  Component Value Date   INSULIN 38.6 (H) 10/18/2019   INSULIN 23.3 06/06/2019   INSULIN 43.3 (H) 11/14/2018   Lab Results  Component Value Date   TSH 1.150 10/18/2019   Lab Results  Component Value Date   CHOL 193  10/18/2019   HDL 33 (L) 10/18/2019   LDLCALC 131 (H) 10/18/2019   TRIG 159 (H) 10/18/2019   CHOLHDL 5.4 (H) 02/21/2019   Lab Results  Component Value Date   WBC 8.2 02/21/2019   HGB 14.1 02/21/2019   HCT 42.9 02/21/2019   MCV 87 02/21/2019   PLT 312 02/21/2019   No results found for: IRON, TIBC, FERRITIN  Attestation Statements:   Reviewed by clinician on day of visit: allergies, medications, problem list, medical history, surgical history, family history, social history, and previous encounter notes.  Migdalia Dk, am acting as Location manager for [employer], DO   I have reviewed the above documentation for accuracy and completeness, and I agree with the above. Jearld Lesch, DO

## 2019-12-02 ENCOUNTER — Other Ambulatory Visit (INDEPENDENT_AMBULATORY_CARE_PROVIDER_SITE_OTHER): Payer: Self-pay | Admitting: Physician Assistant

## 2019-12-02 DIAGNOSIS — R7303 Prediabetes: Secondary | ICD-10-CM

## 2019-12-03 NOTE — Telephone Encounter (Signed)
Please advise if refill okay

## 2019-12-03 NOTE — Telephone Encounter (Signed)
Last seen by Dr. Brown. 

## 2019-12-19 ENCOUNTER — Encounter (INDEPENDENT_AMBULATORY_CARE_PROVIDER_SITE_OTHER): Payer: Self-pay | Admitting: Physician Assistant

## 2019-12-19 ENCOUNTER — Other Ambulatory Visit: Payer: Self-pay

## 2019-12-19 ENCOUNTER — Ambulatory Visit (INDEPENDENT_AMBULATORY_CARE_PROVIDER_SITE_OTHER): Payer: BC Managed Care – PPO | Admitting: Physician Assistant

## 2019-12-19 VITALS — BP 135/84 | HR 98 | Temp 98.4°F | Ht 64.0 in | Wt 249.0 lb

## 2019-12-19 DIAGNOSIS — Z6841 Body Mass Index (BMI) 40.0 and over, adult: Secondary | ICD-10-CM

## 2019-12-19 DIAGNOSIS — I10 Essential (primary) hypertension: Secondary | ICD-10-CM | POA: Diagnosis not present

## 2019-12-19 DIAGNOSIS — E559 Vitamin D deficiency, unspecified: Secondary | ICD-10-CM

## 2019-12-19 NOTE — Progress Notes (Signed)
Chief Complaint:   OBESITY Carly [name] is here to discuss her progress with her obesity treatment plan along with follow-up of her obesity related diagnoses. Carly [name] is keeping a food journal and adhering to recommended goals of 1600 calories and 100 grams of protein and states she is following her eating plan approximately 85% of the time. Carly [name] states she is walking 20 minutes 2-3 times per week.  Today's visit was #: 22 Starting weight: 265 lbs Starting date: 11/14/2018 Today's weight: 249 lbs Today's date: 12/19/2019 Total lbs lost to date: 16 Total lbs lost since last in-office visit: 0  Interim History: Carly [name] prefers to journal and is reaching 1600 calories and averaging about 95 grams of protein. She is drinking protein shakes every other day to meet her protein goal and sometimes eats protein bars. She is on 2.4 mg of Saxenda.  Subjective:   Vitamin D deficiency. Carly [name] is on Vitamin D 1,000 units daily, which she is tolerating well.    Ref. Range 06/06/2019 14:07  Vitamin D, 25-Hydroxy Latest Ref Range: 30.0 - 100.0 ng/mL 68.6   Essential hypertension. No  chest pain or headache. Carly [name] is on Microzide.  BP Readings from Last 3 Encounters:  12/19/19 135/84  11/29/19 136/81  11/07/19 (!) 141/84   Lab Results  Component Value Date   CREATININE 0.80 10/18/2019   CREATININE 0.94 06/06/2019   CREATININE 1.03 (H) 02/21/2019   Assessment/Plan:   Vitamin D deficiency. Low Vitamin D level contributes to fatigue and are associated with obesity, breast, and colon cancer. She agrees to continue to take Vitamin D as directed and will follow-up for routine testing of Vitamin D, at least 2-3 times per year to avoid over-replacement.  Essential hypertension. Carly [name] is working on healthy weight loss and exercise to improve blood pressure control. We will watch for signs of hypotension as she continues her lifestyle modifications. She will continue her medication as directed.    Class 3 severe obesity with serious comorbidity and body mass index (BMI) of 40.0 to 44.9 in adult, unspecified obesity type (Carly [name]).  Carly [name] is currently in the action stage of change. As such, her goal is to continue with weight loss efforts. She has agreed to keeping a food journal and adhering to recommended goals of 1600 calories and 100 grams of protein daily.   Exercise goals: For substantial health benefits, adults should do at least 150 minutes (2 hours and 30 minutes) a week of moderate-intensity, or 75 minutes (1 hour and 15 minutes) a week of vigorous-intensity aerobic physical activity, or an equivalent combination of moderate- and vigorous-intensity aerobic activity. Aerobic activity should be performed in episodes of at least 10 minutes, and preferably, it should be spread throughout the week.  Behavioral modification strategies: increasing lean protein intake and meal planning and cooking strategies.  Carly [name] has agreed to follow-up with our clinic in 3 weeks. She was informed of the importance of frequent follow-up visits to maximize her success with intensive lifestyle modifications for her multiple health conditions.   Objective:   Blood pressure 135/84, pulse 98, temperature 98.4 F (36.9 C), height 5\' 4"  (1.626 m), weight 249 lb (112.9 kg), SpO2 97 %. Body mass index is 42.74 kg/m.  General: Cooperative, alert, well developed, in no acute distress. HEENT: Conjunctivae and lids unremarkable. Cardiovascular: Regular rhythm.  Lungs: Normal work of breathing. Neurologic: No focal deficits.   Lab Results  Component Value Date   CREATININE 0.80 10/18/2019   BUN 18  10/18/2019   NA 141 10/18/2019   K 4.1 10/18/2019   CL 102 10/18/2019   CO2 24 10/18/2019   Lab Results  Component Value Date   ALT 25 10/18/2019   AST 17 10/18/2019   ALKPHOS 103 10/18/2019   BILITOT <0.2 10/18/2019   Lab Results  Component Value Date   HGBA1C 5.6 10/18/2019   HGBA1C 5.6 06/06/2019    HGBA1C 5.9 (H) 02/21/2019   HGBA1C 6.0 (H) 11/14/2018   Lab Results  Component Value Date   INSULIN 38.6 (H) 10/18/2019   INSULIN 23.3 06/06/2019   INSULIN 43.3 (H) 11/14/2018   Lab Results  Component Value Date   TSH 1.150 10/18/2019   Lab Results  Component Value Date   CHOL 193 10/18/2019   HDL 33 (L) 10/18/2019   LDLCALC 131 (H) 10/18/2019   TRIG 159 (H) 10/18/2019   CHOLHDL 5.4 (H) 02/21/2019   Lab Results  Component Value Date   WBC 8.2 02/21/2019   HGB 14.1 02/21/2019   HCT 42.9 02/21/2019   MCV 87 02/21/2019   PLT 312 02/21/2019   No results found for: IRON, TIBC, FERRITIN  Attestation Statements:   Reviewed by clinician on day of visit: allergies, medications, problem list, medical history, surgical history, family history, social history, and previous encounter notes.  Time spent on visit including pre-visit chart review and post-visit charting and care was 31 minutes.   Carly [name], am acting as transcriptionist for Carly Potash, PA-C   I have reviewed the above documentation for accuracy and completeness, and I agree with the above. Carly Potash, PA-C

## 2019-12-24 ENCOUNTER — Encounter (INDEPENDENT_AMBULATORY_CARE_PROVIDER_SITE_OTHER): Payer: Self-pay

## 2019-12-24 ENCOUNTER — Other Ambulatory Visit (INDEPENDENT_AMBULATORY_CARE_PROVIDER_SITE_OTHER): Payer: Self-pay | Admitting: Bariatrics

## 2019-12-24 DIAGNOSIS — Z6841 Body Mass Index (BMI) 40.0 and over, adult: Secondary | ICD-10-CM

## 2019-12-24 NOTE — Telephone Encounter (Signed)
Last seen by Tracey 

## 2019-12-24 NOTE — Telephone Encounter (Signed)
Sent patient a mychart message.

## 2019-12-26 ENCOUNTER — Other Ambulatory Visit (INDEPENDENT_AMBULATORY_CARE_PROVIDER_SITE_OTHER): Payer: Self-pay | Admitting: Physician Assistant

## 2019-12-26 ENCOUNTER — Other Ambulatory Visit (INDEPENDENT_AMBULATORY_CARE_PROVIDER_SITE_OTHER): Payer: Self-pay | Admitting: Bariatrics

## 2019-12-26 DIAGNOSIS — I10 Essential (primary) hypertension: Secondary | ICD-10-CM

## 2019-12-26 DIAGNOSIS — R7303 Prediabetes: Secondary | ICD-10-CM

## 2019-12-26 NOTE — Telephone Encounter (Signed)
This patient was last seen by Abby Potash, PA-C, and currently has an upcoming appt scheduled on 01/09/20 with Mina Marble, FNP.

## 2019-12-26 NOTE — Telephone Encounter (Signed)
Spoke with the patient at 10:14 am on 12/26/19. Patient stated that she has enough Saxenda to last her until her next appointment. She would like a refill during her appointment on 01/09/2020. Conversation ended at 10:16 am on 12/26/19

## 2019-12-26 NOTE — Telephone Encounter (Signed)
Left patient a voice mail

## 2019-12-26 NOTE — Telephone Encounter (Signed)
I called the patient on 12/26/19.

## 2019-12-26 NOTE — Telephone Encounter (Signed)
She does not need the Metformin as well as the [place] as of 12/26/19 at 12:51 pm and will wait until her upcoming appointment to get them refilled.

## 2020-01-01 ENCOUNTER — Encounter (INDEPENDENT_AMBULATORY_CARE_PROVIDER_SITE_OTHER): Payer: Self-pay

## 2020-01-01 NOTE — Telephone Encounter (Signed)
Ok to send    thanks

## 2020-01-09 ENCOUNTER — Ambulatory Visit (INDEPENDENT_AMBULATORY_CARE_PROVIDER_SITE_OTHER): Payer: BC Managed Care – PPO | Admitting: Adult Health

## 2020-01-09 ENCOUNTER — Encounter (INDEPENDENT_AMBULATORY_CARE_PROVIDER_SITE_OTHER): Payer: Self-pay | Admitting: Adult Health

## 2020-01-09 ENCOUNTER — Other Ambulatory Visit: Payer: Self-pay

## 2020-01-09 VITALS — BP 141/80 | HR 85 | Temp 97.7°F | Ht 64.0 in | Wt 249.0 lb

## 2020-01-09 DIAGNOSIS — I10 Essential (primary) hypertension: Secondary | ICD-10-CM

## 2020-01-09 DIAGNOSIS — R7303 Prediabetes: Secondary | ICD-10-CM

## 2020-01-09 DIAGNOSIS — Z9189 Other specified personal risk factors, not elsewhere classified: Secondary | ICD-10-CM | POA: Diagnosis not present

## 2020-01-09 DIAGNOSIS — Z6841 Body Mass Index (BMI) 40.0 and over, adult: Secondary | ICD-10-CM

## 2020-01-09 MED ORDER — SAXENDA 18 MG/3ML ~~LOC~~ SOPN: 3.0000 mg | PEN_INJECTOR | Freq: Every day | SUBCUTANEOUS | 0 refills | Status: DC

## 2020-01-09 NOTE — Progress Notes (Signed)
Chief Complaint:   Carly [name] is here to discuss her progress with her Carly treatment plan along with follow-up of her Carly related diagnoses. Carly [name] is keeping a food journal and adhering to recommended goals of 1600 calories and 100 grams of protein and states she is following her eating plan approximately 80% of the time. Carly [name] states she is walking 20 minutes 2 times per week.  Today's visit was #: 23 Starting weight: 265 lbs Starting date: 11/14/2018 Today's weight: 249 lbs Today's date: 01/09/2020 Total lbs lost to date: 16 Total lbs lost since last in-office visit: 0  Interim History: Carly [name] estimates to track intake greater than 80% of the time and she feels she achieves her calorie goal most days and protein goal at least 3 days a week. She is on Saxenda 2.4 mg daily and denies mass in neck, dysphagia, dyspepsia, or persistent hoarseness. She denies constipation.  Subjective:   Prediabetes. Carly [name] has a diagnosis of prediabetes based on her elevated HgA1c and was informed this puts her at greater risk of developing diabetes. She continues to work on diet and exercise to decrease her risk of diabetes. She denies nausea or hypoglycemia. A1c has improved on the last 2 checks. Carly [name] is on metformin 1000 mg daily and denies GI upset. Also on GLP-1: Saxenda 2.4mg  QD.  Lab Results  Component Value Date   HGBA1C 5.6 10/18/2019   Lab Results  Component Value Date   INSULIN 38.6 (H) 10/18/2019   INSULIN 23.3 06/06/2019   INSULIN 43.3 (H) 11/14/2018   Essential hypertension. Systolic blood pressure is elevated at today's office visit. Carly [name] was previously on lisinopril/HCTZ 20/25, decreased to 10/25, then changed to just HCTZ 12.5 mg. She denies cardiac symptoms. Systolic blood pressure [id number] at the last several office visits.  BP Readings from Last 3 Encounters:  01/09/20 (!) 141/80  12/19/19 135/84  11/29/19 136/81   Lab Results  Component Value Date    CREATININE 0.80 10/18/2019   CREATININE 0.94 06/06/2019   CREATININE 1.03 (H) 02/21/2019   At increased risk of exposure to COVID-19 virus. The patient is at higher risk of COVID-19 infection due to higher infection rates locally. Carly [name] has yet to receive COVID-19 vaccines. We discussed risks/benefits of COVID-19 vaccine today; she deferred at this time.  Assessment/Plan:   Prediabetes. Carly [name] will continue to work on weight loss, exercise, and decreasing simple carbohydrates to help decrease the risk of diabetes. She will continue metformin and journaling. Labs will be checked at her next office visit.  Essential hypertension. Carly [name] is working on healthy weight loss and exercise to improve blood pressure control. We will watch for signs of hypotension as she continues her lifestyle modifications. Will monitor and adjust prescription medication if appropriate at her next office visit.  At increased risk of exposure to COVID-19 virus. Carly [name] was given approximately 15 minutes of COVID prevention counseling today Counseling  COVID-19 is a respiratory infection that is caused by a virus. It can cause serious infections, such as pneumonia, acute respiratory distress syndrome, acute respiratory failure, or sepsis.  You are more likely to develop a serious illness if you are 27 years of age or older, have a weak immune system, live in a nursing home, have chronic disease, or have Carly.  Get vaccinated as soon as they are available to you.  For our most current information, please visit [email or web address].  Wash your hands often with soap and water for 20 seconds. If soap  and water are not available, use alcohol-based hand sanitizer.  Wear a face mask. Make sure your mask covers your nose and mouth.  Maintain at least 6 feet distance from others when in public.  Get help right away if  You have trouble breathing, chest pain, confusion, or other concerning symptoms.  Repetitive  spaced learning was employed today to elicit superior memory formation and behavioral change.  Class 3 severe Carly with serious comorbidity and body mass index (BMI) of 40.0 to 44.9 in adult, unspecified Carly type ([facility]). Refill was given for Liraglutide -Weight Management (SAXENDA) 18 MG/3ML SOPN 2.4 mg daily, dispense 15 mL with 0 refills.  Carly [name] is currently in the action stage of change. As such, her goal is to continue with weight loss efforts. She has agreed to keeping a food journal and adhering to recommended goals of 1600 calories and 100 grams of protein daily.   Exercise goals: Carly [name] will continue walking 20 minutes 2 times per week.  Behavioral modification strategies: increasing lean protein intake, increasing high fiber foods, meal planning and cooking strategies, planning for success and keeping a strict food journal.  Carly [name] has agreed to follow-up with our clinic fasting in 3 weeks. She was informed of the importance of frequent follow-up visits to maximize her success with intensive lifestyle modifications for her multiple health conditions.   Objective:   Blood pressure (!) 141/80, pulse 85, temperature 97.7 F (36.5 C), height 5\' 4"  (1.626 m), weight 249 lb (112.9 kg), SpO2 98 %. Body mass index is 42.74 kg/m.  General: Cooperative, alert, well developed, in no acute distress. HEENT: Conjunctivae and lids unremarkable. Cardiovascular: Regular rhythm.  Lungs: Normal work of breathing. Neurologic: No focal deficits.   Lab Results  Component Value Date   CREATININE 0.80 10/18/2019   BUN 18 10/18/2019   NA 141 10/18/2019   K 4.1 10/18/2019   CL 102 10/18/2019   CO2 24 10/18/2019   Lab Results  Component Value Date   ALT 25 10/18/2019   AST 17 10/18/2019   ALKPHOS 103 10/18/2019   BILITOT <0.2 10/18/2019   Lab Results  Component Value Date   HGBA1C 5.6 10/18/2019   HGBA1C 5.6 06/06/2019   HGBA1C 5.9 (H) 02/21/2019   HGBA1C 6.0 (H) 11/14/2018   Lab  Results  Component Value Date   INSULIN 38.6 (H) 10/18/2019   INSULIN 23.3 06/06/2019   INSULIN 43.3 (H) 11/14/2018   Lab Results  Component Value Date   TSH 1.150 10/18/2019   Lab Results  Component Value Date   CHOL 193 10/18/2019   HDL 33 (L) 10/18/2019   LDLCALC 131 (H) 10/18/2019   TRIG 159 (H) 10/18/2019   CHOLHDL 5.4 (H) 02/21/2019   Lab Results  Component Value Date   WBC 8.2 02/21/2019   HGB 14.1 02/21/2019   HCT 42.9 02/21/2019   MCV 87 02/21/2019   PLT 312 02/21/2019   No results found for: IRON, TIBC, FERRITIN  Attestation Statements:   Reviewed by clinician on day of visit: allergies, medications, problem list, medical history, surgical history, family history, social history, and previous encounter notes.  I, 04/21/2019, am acting as [employer] for Energy manager, NP-C   I have reviewed the above documentation for accuracy and completeness, and I agree with the above. -  Katy d. Danford, NP-C

## 2020-01-17 ENCOUNTER — Other Ambulatory Visit: Payer: Self-pay | Admitting: Internal Medicine

## 2020-01-25 ENCOUNTER — Other Ambulatory Visit (INDEPENDENT_AMBULATORY_CARE_PROVIDER_SITE_OTHER): Payer: Self-pay | Admitting: Physician Assistant

## 2020-01-25 DIAGNOSIS — I10 Essential (primary) hypertension: Secondary | ICD-10-CM

## 2020-01-28 NOTE — Telephone Encounter (Signed)
Carly [name]

## 2020-01-29 NOTE — Telephone Encounter (Signed)
Refill request

## 2020-01-30 ENCOUNTER — Encounter (INDEPENDENT_AMBULATORY_CARE_PROVIDER_SITE_OTHER): Payer: Self-pay

## 2020-01-30 NOTE — Telephone Encounter (Signed)
Carly [name] is ooo. Refill request

## 2020-02-04 ENCOUNTER — Encounter (INDEPENDENT_AMBULATORY_CARE_PROVIDER_SITE_OTHER): Payer: Self-pay

## 2020-02-04 ENCOUNTER — Telehealth (INDEPENDENT_AMBULATORY_CARE_PROVIDER_SITE_OTHER): Payer: BC Managed Care – PPO | Admitting: Adult Health

## 2020-02-04 ENCOUNTER — Encounter (INDEPENDENT_AMBULATORY_CARE_PROVIDER_SITE_OTHER): Payer: Self-pay | Admitting: Adult Health

## 2020-02-04 ENCOUNTER — Other Ambulatory Visit: Payer: Self-pay

## 2020-02-04 DIAGNOSIS — I10 Essential (primary) hypertension: Secondary | ICD-10-CM

## 2020-02-04 DIAGNOSIS — Z6841 Body Mass Index (BMI) 40.0 and over, adult: Secondary | ICD-10-CM | POA: Diagnosis not present

## 2020-02-04 DIAGNOSIS — R7303 Prediabetes: Secondary | ICD-10-CM | POA: Diagnosis not present

## 2020-02-04 MED ORDER — METFORMIN HCL 1000 MG PO TABS: 1000.0000 mg | ORAL_TABLET | Freq: Every day | ORAL | 0 refills | Status: DC

## 2020-02-06 NOTE — Progress Notes (Signed)
TeleHealth Visit:  Due to the COVID-19 pandemic, this visit was completed with telemedicine (audio/video) technology to reduce patient and provider exposure as well as to preserve personal protective equipment.   Antaniya has verbally consented to this TeleHealth visit. The patient is located at home, the provider is located at the [facility] office. The participants in this visit include the listed provider and patient. The visit was conducted today via video.  Chief Complaint: OBESITY Kyleena is here to discuss her progress with her obesity treatment plan along with follow-up of her obesity related diagnoses. Alyce is on keeping a food journal and adhering to recommended goals of 1600 calories and 100 g protein and states she is following her eating plan approximately 85% of the time. Montgomery states she is walking 15 minutes 3 times per week.  Today's visit was #: 24 Starting weight: 265 lbs Starting date: 11/14/2018  Interim History: Kirke Shaggy has been on backorder. Tilla is waiting to pick up prescription from pharmacy. She is setting an interval goal of starting to lose weight again, not just maintaining. She is unable to check weight or BP at home. She feels that she has maintained her weight since last OV- 249 lbs.  Subjective:   1. Pre-diabetes Lurdes has a diagnosis of prediabetes based on her elevated HgA1c and was informed this puts her at greater risk of developing diabetes. She continues to work on diet and exercise to decrease her risk of diabetes. She denies nausea or hypoglycemia. 10/18/2019 BG 90, A1c 5.6, insulin level 38.6, which is elevated from last check on 06/06/2019 of 23.3. She is on Metformin 1,000 mg daily at breakfast and tolerating well. 10/18/2019 CMP GFR 88.  Lab Results  Component Value Date   HGBA1C 5.6 10/18/2019   Lab Results  Component Value Date   INSULIN 38.6 (H) 10/18/2019   INSULIN 23.3 06/06/2019   INSULIN 43.3 (H) 11/14/2018    Ref.  Range 10/18/2019 07:48  GFR, Est Non African American Latest Ref Range: >59 mL/min/1.73 88    2. Essential hypertension Caral denies acute cardiac symptoms. She is unable to check ambulatory BP. She is on HCTZ 12.5 mg every day. BP has been elevated slightly the last several OVs.  BP Readings from Last 3 Encounters:  01/09/20 (!) 141/80  12/19/19 135/84  11/29/19 136/81   Assessment/Plan:   1. Pre-diabetes Britlee will continue to work on weight loss, exercise, and decreasing simple carbohydrates to help decrease the risk of diabetes. We will refill Metformin for 1 month, as per below.  - metFORMIN (GLUCOPHAGE) 1000 MG tablet; Take 1 tablet (1,000 mg total) by mouth daily.  Dispense: 30 tablet; Refill: 0  2. Essential hypertension Ercel is working on healthy weight loss and exercise to improve blood pressure control. We will watch for signs of hypotension as she continues her lifestyle modifications. If BP is above goal at next OV, adjust anti-hypertensive treatment.   3. Class 3 severe obesity with serious comorbidity and body mass index (BMI) of 40.0 to 44.9 in adult, unspecified obesity type [facility]) Vieno is currently in the action stage of change. As such, her goal is to continue with weight loss efforts. She has agreed to keeping a food journal and adhering to recommended goals of 1600 calories and 100 g protein.   Strives to consume at least 100 grams of protein a day.  Exercise goals: As is  Behavioral modification strategies: increasing lean protein intake, decreasing simple carbohydrates, increasing water  intake, no skipping meals, meal planning and cooking strategies, planning for success and keeping a strict food journal.  Valinda has agreed to follow-up with our clinic in 2 weeks fasting. She was informed of the importance of frequent follow-up visits to maximize her success with intensive lifestyle modifications for her multiple health conditions.  Objective:   VITALS: Per  patient if applicable, see vitals. GENERAL: Alert and in no acute distress. CARDIOPULMONARY: No increased WOB. Speaking in clear sentences.  PSYCH: Pleasant and cooperative. Speech normal rate and rhythm. Affect is appropriate. Insight and judgement are appropriate. Attention is focused, linear, and appropriate.  NEURO: Oriented as arrived to appointment on time with no prompting.   Lab Results  Component Value Date   CREATININE 0.80 10/18/2019   BUN 18 10/18/2019   NA 141 10/18/2019   K 4.1 10/18/2019   CL 102 10/18/2019   CO2 24 10/18/2019   Lab Results  Component Value Date   ALT 25 10/18/2019   AST 17 10/18/2019   ALKPHOS 103 10/18/2019   BILITOT <0.2 10/18/2019   Lab Results  Component Value Date   HGBA1C 5.6 10/18/2019   HGBA1C 5.6 06/06/2019   HGBA1C 5.9 (H) 02/21/2019   HGBA1C 6.0 (H) 11/14/2018   Lab Results  Component Value Date   INSULIN 38.6 (H) 10/18/2019   INSULIN 23.3 06/06/2019   INSULIN 43.3 (H) 11/14/2018   Lab Results  Component Value Date   TSH 1.150 10/18/2019   Lab Results  Component Value Date   CHOL 193 10/18/2019   HDL 33 (L) 10/18/2019   LDLCALC 131 (H) 10/18/2019   TRIG 159 (H) 10/18/2019   CHOLHDL 5.4 (H) 02/21/2019   Lab Results  Component Value Date   WBC 8.2 02/21/2019   HGB 14.1 02/21/2019   HCT 42.9 02/21/2019   MCV 87 02/21/2019   PLT 312 02/21/2019    Attestation Statements:   Reviewed by clinician on day of visit: allergies, medications, problem list, medical history, surgical history, family history, social history, and previous encounter notes.  Time spent on visit including pre-visit chart review and post-visit charting and care was 32 minutes.   Coral Ceo, am acting as Location manager for Mina Marble, NP.  I have reviewed the above documentation for accuracy and completeness, and I agree with the above. -  Kinser Fellman d. Christhoper Busbee, NP-C

## 2020-02-19 ENCOUNTER — Ambulatory Visit (INDEPENDENT_AMBULATORY_CARE_PROVIDER_SITE_OTHER): Payer: Self-pay | Admitting: Adult Health

## 2020-02-20 ENCOUNTER — Other Ambulatory Visit (INDEPENDENT_AMBULATORY_CARE_PROVIDER_SITE_OTHER): Payer: Self-pay | Admitting: Bariatrics

## 2020-02-20 DIAGNOSIS — Z6841 Body Mass Index (BMI) 40.0 and over, adult: Secondary | ICD-10-CM

## 2020-02-21 ENCOUNTER — Ambulatory Visit (INDEPENDENT_AMBULATORY_CARE_PROVIDER_SITE_OTHER): Payer: Self-pay | Admitting: Adult Health

## 2020-02-21 NOTE — Telephone Encounter (Signed)
Refill request

## 2020-02-23 ENCOUNTER — Other Ambulatory Visit (INDEPENDENT_AMBULATORY_CARE_PROVIDER_SITE_OTHER): Payer: Self-pay | Admitting: Family Medicine

## 2020-02-23 DIAGNOSIS — I10 Essential (primary) hypertension: Secondary | ICD-10-CM

## 2020-02-24 ENCOUNTER — Other Ambulatory Visit (INDEPENDENT_AMBULATORY_CARE_PROVIDER_SITE_OTHER): Payer: Self-pay | Admitting: Adult Health

## 2020-02-25 NOTE — Telephone Encounter (Signed)
Last seen by Mina Marble.

## 2020-02-26 ENCOUNTER — Other Ambulatory Visit (INDEPENDENT_AMBULATORY_CARE_PROVIDER_SITE_OTHER): Payer: Self-pay | Admitting: Adult Health

## 2020-02-26 DIAGNOSIS — R7303 Prediabetes: Secondary | ICD-10-CM

## 2020-02-27 NOTE — Telephone Encounter (Signed)
Refill request

## 2020-03-05 ENCOUNTER — Telehealth (INDEPENDENT_AMBULATORY_CARE_PROVIDER_SITE_OTHER): Payer: BC Managed Care – PPO | Admitting: Adult Health

## 2020-03-05 ENCOUNTER — Other Ambulatory Visit: Payer: Self-pay

## 2020-03-05 ENCOUNTER — Encounter (INDEPENDENT_AMBULATORY_CARE_PROVIDER_SITE_OTHER): Payer: Self-pay | Admitting: Adult Health

## 2020-03-05 DIAGNOSIS — R7303 Prediabetes: Secondary | ICD-10-CM | POA: Diagnosis not present

## 2020-03-05 DIAGNOSIS — E7849 Other hyperlipidemia: Secondary | ICD-10-CM

## 2020-03-05 DIAGNOSIS — Z6841 Body Mass Index (BMI) 40.0 and over, adult: Secondary | ICD-10-CM | POA: Diagnosis not present

## 2020-03-05 MED ORDER — METFORMIN HCL 1000 MG PO TABS: 1000.0000 mg | ORAL_TABLET | Freq: Every day | ORAL | 0 refills | Status: DC

## 2020-03-05 MED ORDER — SAXENDA 18 MG/3ML ~~LOC~~ SOPN: 3.0000 mg | PEN_INJECTOR | Freq: Every day | SUBCUTANEOUS | 0 refills | Status: DC

## 2020-03-06 NOTE — Progress Notes (Signed)
TeleHealth Visit:  Due to the COVID-19 pandemic, this visit was completed with telemedicine (audio/video) technology to reduce patient and provider exposure as well as to preserve personal protective equipment.   Carly [name] has verbally consented to this TeleHealth visit. The patient is located at home, the provider is located at the [facility] office. The participants in this visit include the listed provider and patient. The visit was conducted today via video.   Chief Complaint: OBESITY Carly [name] is here to discuss her progress with her obesity treatment plan along with follow-up of her obesity related diagnoses. Carly [name] is on keeping a food journal and adhering to recommended goals of 1600 calories and 100 g protein and states she is following her eating plan approximately 50% of the time. Carly [name] states she is doing 0 minutes 0 times per week.  Today's visit was #: 25 Starting weight: 265 lbs Starting date: 11/14/2018  Interim History: Carly [name] has headache, nasal congestion, and low grade fever of 100.67F oral, symptoms that started last night. She denies known exposure to COVID. She has not been vaccinated with any COVID-19 immunizations. She will more consistently track M-F but is challenged to input intake on weekends.  Of note: She has been on Category 4 and low carb meal plan in the past, and prefers journaling above all.  Subjective:   1. Pre-diabetes Carly [name] is on Metformin 1000 mg daily and Saxenda 2.4 mg. She is tolerating both well. Her A1c is improving with worsening IR at last 2 lab checks.   Lab Results  Component Value Date   HGBA1C 5.6 10/18/2019   Lab Results  Component Value Date   INSULIN 38.6 (H) 10/18/2019   INSULIN 23.3 06/06/2019   INSULIN 43.3 (H) 11/14/2018    2. Other hyperlipidemia 10/18/2019 lipid panel resulted a low HDL with elevated triglycerides and LDL. Pt is not on statin therapy.  Lab Results  Component Value Date   ALT 25 10/18/2019    AST 17 10/18/2019   ALKPHOS 103 10/18/2019   BILITOT <0.2 10/18/2019   Lab Results  Component Value Date   CHOL 193 10/18/2019   HDL 33 (L) 10/18/2019   LDLCALC 131 (H) 10/18/2019   TRIG 159 (H) 10/18/2019   CHOLHDL 5.4 (H) 02/21/2019    Assessment/Plan:   1. Pre-diabetes Carly [name] will continue to work on weight loss, exercise, and decreasing simple carbohydrates to help decrease the risk of diabetes. Check labs at next OV.  - metFORMIN (GLUCOPHAGE) 1000 MG tablet; Take 1 tablet (1,000 mg total) by mouth daily.  Dispense: 30 tablet; Refill: 0  2. Other hyperlipidemia Cardiovascular risk and specific lipid/LDL goals reviewed.  We discussed several lifestyle modifications today and Sana will continue to work on diet, exercise and weight loss efforts. Orders and follow up as documented in patient record. Check labs at next OV.  Counseling Intensive lifestyle modifications are the first line treatment for this issue. . Dietary changes: Increase soluble fiber. Decrease simple carbohydrates. . Exercise changes: Moderate to vigorous-intensity aerobic activity 150 minutes per week if tolerated. . Lipid-lowering medications: see documented in medical record.  3. Class 3 severe obesity with serious comorbidity and body mass index (BMI) of 40.0 to 44.9 in adult, unspecified obesity type Carly [name]) Carly [name] is currently in the action stage of change. As such, her goal is to continue with weight loss efforts. She has agreed to keeping a food journal and adhering to recommended goals of 1600 calories and 100 g protein.  Recommend Carly [name] to take at home antigen COVID-19 test, and remain home with acute illness symptoms.  We discussed various medication options to help Carly [name] with her weight loss efforts and we both agreed to continue Saxenda 2.4 mg, as per below. - Liraglutide -Weight Management (SAXENDA) 18 MG/3ML SOPN; Inject 3 mg into the skin daily. Pt knows to inject 2.4mg  once daily  Dispense: 15 mL;  Refill: 0  Exercise goals: As is  Behavioral modification strategies: increasing lean protein intake, decreasing simple carbohydrates, no skipping meals, meal planning and cooking strategies, planning for success and keeping a strict food journal.  Carly [name] has agreed to follow-up with our clinic in March. She was informed of the importance of frequent follow-up visits to maximize her success with intensive lifestyle modifications for her multiple health conditions.  Objective:   VITALS: Per patient if applicable, see vitals. GENERAL: Alert and in no acute distress. CARDIOPULMONARY: No increased WOB. Speaking in clear sentences.  PSYCH: Pleasant and cooperative. Speech normal rate and rhythm. Affect is appropriate. Insight and judgement are appropriate. Attention is focused, linear, and appropriate.  NEURO: Oriented as arrived to appointment on time with no prompting.   Lab Results  Component Value Date   CREATININE 0.80 10/18/2019   BUN 18 10/18/2019   NA 141 10/18/2019   K 4.1 10/18/2019   CL 102 10/18/2019   CO2 24 10/18/2019   Lab Results  Component Value Date   ALT 25 10/18/2019   AST 17 10/18/2019   ALKPHOS 103 10/18/2019   BILITOT <0.2 10/18/2019   Lab Results  Component Value Date   HGBA1C 5.6 10/18/2019   HGBA1C 5.6 06/06/2019   HGBA1C 5.9 (H) 02/21/2019   HGBA1C 6.0 (H) 11/14/2018   Lab Results  Component Value Date   INSULIN 38.6 (H) 10/18/2019   INSULIN 23.3 06/06/2019   INSULIN 43.3 (H) 11/14/2018   Lab Results  Component Value Date   TSH 1.150 10/18/2019   Lab Results  Component Value Date   CHOL 193 10/18/2019   HDL 33 (L) 10/18/2019   LDLCALC 131 (H) 10/18/2019   TRIG 159 (H) 10/18/2019   CHOLHDL 5.4 (H) 02/21/2019   Lab Results  Component Value Date   WBC 8.2 02/21/2019   HGB 14.1 02/21/2019   HCT 42.9 02/21/2019   MCV 87 02/21/2019   PLT 312 02/21/2019   No results found for: IRON, TIBC, FERRITIN  Attestation Statements:   Reviewed  by clinician on day of visit: allergies, medications, problem list, medical history, surgical history, family history, social history, and previous encounter notes.  Time spent on visit including pre-visit chart review and post-visit charting and care was 32 minutes.   Coral Ceo, am acting as Location manager for Mina Marble, NP.  I have reviewed the above documentation for accuracy and completeness, and I agree with the above. - Trapper Meech d. Amand Lemoine, NP-C

## 2020-03-21 ENCOUNTER — Other Ambulatory Visit (INDEPENDENT_AMBULATORY_CARE_PROVIDER_SITE_OTHER): Payer: Self-pay | Admitting: Adult Health

## 2020-03-21 DIAGNOSIS — I10 Essential (primary) hypertension: Secondary | ICD-10-CM

## 2020-03-27 ENCOUNTER — Ambulatory Visit: Payer: Self-pay | Admitting: Obstetrics and Gynecology

## 2020-03-28 ENCOUNTER — Other Ambulatory Visit (INDEPENDENT_AMBULATORY_CARE_PROVIDER_SITE_OTHER): Payer: Self-pay | Admitting: Adult Health

## 2020-03-29 ENCOUNTER — Other Ambulatory Visit (INDEPENDENT_AMBULATORY_CARE_PROVIDER_SITE_OTHER): Payer: Self-pay | Admitting: Adult Health

## 2020-03-29 DIAGNOSIS — R7303 Prediabetes: Secondary | ICD-10-CM

## 2020-03-31 ENCOUNTER — Encounter (INDEPENDENT_AMBULATORY_CARE_PROVIDER_SITE_OTHER): Payer: Self-pay | Admitting: Adult Health

## 2020-03-31 ENCOUNTER — Ambulatory Visit (INDEPENDENT_AMBULATORY_CARE_PROVIDER_SITE_OTHER): Payer: BC Managed Care – PPO | Admitting: Adult Health

## 2020-03-31 ENCOUNTER — Other Ambulatory Visit: Payer: Self-pay

## 2020-03-31 VITALS — BP 138/84 | HR 88 | Temp 97.4°F | Ht 64.0 in | Wt 254.0 lb

## 2020-03-31 DIAGNOSIS — Z9189 Other specified personal risk factors, not elsewhere classified: Secondary | ICD-10-CM | POA: Diagnosis not present

## 2020-03-31 DIAGNOSIS — E559 Vitamin D deficiency, unspecified: Secondary | ICD-10-CM

## 2020-03-31 DIAGNOSIS — I10 Essential (primary) hypertension: Secondary | ICD-10-CM

## 2020-03-31 DIAGNOSIS — R7303 Prediabetes: Secondary | ICD-10-CM

## 2020-03-31 DIAGNOSIS — E782 Mixed hyperlipidemia: Secondary | ICD-10-CM | POA: Diagnosis not present

## 2020-03-31 DIAGNOSIS — Z6841 Body Mass Index (BMI) 40.0 and over, adult: Secondary | ICD-10-CM

## 2020-03-31 MED ORDER — SAXENDA 18 MG/3ML ~~LOC~~ SOPN
3.0000 mg | PEN_INJECTOR | Freq: Every day | SUBCUTANEOUS | 0 refills | Status: DC
Start: 2020-03-31 — End: 2021-06-25

## 2020-03-31 MED ORDER — HYDROCHLOROTHIAZIDE 12.5 MG PO CAPS: 12.5000 mg | ORAL_CAPSULE | Freq: Every day | ORAL | 0 refills | Status: DC

## 2020-04-01 LAB — INSULIN, RANDOM: INSULIN: 32.2 u[IU]/mL — ABNORMAL HIGH (ref 2.6–24.9)

## 2020-04-01 LAB — COMPREHENSIVE METABOLIC PANEL
ALT: 31 IU/L (ref 0–32)
AST: 22 IU/L (ref 0–40)
Albumin/Globulin Ratio: 1.5 (ref 1.2–2.2)
Albumin: 4.1 g/dL (ref 3.8–4.8)
Alkaline Phosphatase: 110 IU/L (ref 44–121)
BUN/Creatinine Ratio: 16 (ref 9–23)
BUN: 13 mg/dL (ref 6–24)
Bilirubin Total: 0.2 mg/dL (ref 0.0–1.2)
CO2: 22 mmol/L (ref 20–29)
Calcium: 9.4 mg/dL (ref 8.7–10.2)
Chloride: 102 mmol/L (ref 96–106)
Creatinine, Ser: 0.79 mg/dL (ref 0.57–1.00)
Globulin, Total: 2.8 g/dL (ref 1.5–4.5)
Glucose: 87 mg/dL (ref 65–99)
Potassium: 4.2 mmol/L (ref 3.5–5.2)
Sodium: 141 mmol/L (ref 134–144)
Total Protein: 6.9 g/dL (ref 6.0–8.5)
eGFR: 92 mL/min/{1.73_m2} (ref >59)

## 2020-04-01 LAB — LIPID PANEL
Chol/HDL Ratio: 5.6 ratio — ABNORMAL HIGH (ref 0.0–4.4)
Cholesterol, Total: 195 mg/dL (ref 100–199)
HDL: 35 mg/dL — ABNORMAL LOW (ref >39)
LDL Chol Calc (NIH): 132 mg/dL — ABNORMAL HIGH (ref 0–99)
Triglycerides: 154 mg/dL — ABNORMAL HIGH (ref 0–149)
VLDL Cholesterol Cal: 28 mg/dL (ref 5–40)

## 2020-04-01 LAB — VITAMIN D 25 HYDROXY (VIT D DEFICIENCY, FRACTURES): Vit D, 25-Hydroxy: 43.1 ng/mL (ref 30.0–100.0)

## 2020-04-01 LAB — HEMOGLOBIN A1C
Est. average glucose Bld gHb Est-mCnc: 126 mg/dL
Hgb A1c MFr Bld: 6 % — ABNORMAL HIGH (ref 4.8–5.6)

## 2020-04-01 NOTE — Progress Notes (Signed)
Chief Complaint:   OBESITY Carly [name] is here to discuss her progress with her obesity treatment plan along with follow-up of her obesity related diagnoses. Carly [name] is on keeping a food journal and adhering to recommended goals of 1600 calories and 100 g protein and states she is following her eating plan approximately 60% of the time. Carly [name] states she is doing 0 minutes 0 times per week.  Today's visit was #: 69 Starting weight: 265 lbs Starting date: 11/14/2018 Today's weight: 254 lbs Today's date: 03/31/2020 Total lbs lost to date: 11 lbs Total lbs lost since last in-office visit: 0  Interim History: Carly [name] estimates to track intake 60% of the time. She will consume 1600-1700 calories with 90-100 g protein. She reports increased snacking on mini candy bars. She has been on Saxenda 2.4 mg QD.  Pt denies mass in neck, dysphagia, dyspepsia, or persistent hoarseness. She will occasionally experience carb cravings.  Subjective:   1. Essential hypertension 10/18/2019 CMP was stable. Carly [name]'s potassium is 4.1 on 10/18/2019.  BP Readings from Last 3 Encounters:  03/31/20 138/84  01/09/20 (!) 141/80  12/19/19 135/84    Ref. Range 10/18/2019 07:48  Potassium Latest Ref Range: 3.5 - 5.2 mmol/L 4.1    2. Pre-diabetes Carly [name]'s A1c improved at last check with elevated insulin level. 10/18/2019 A1c 5.6 with an insulin level of 38.6. She is on Metformin 1000 mg QD and Saxenda for obesity and tolerating both well.  3. Vitamin D deficiency Carly [name]'s Vitamin D level was 68.6 on 06/06/2019. She is currently taking OTC vitamin D 1,000 IU each day. She denies nausea, vomiting or muscle weakness.   Ref. Range 06/06/2019 14:07  Vitamin D, 25-Hydroxy Latest Ref Range: 30.0 - 100.0 ng/mL 68.6   4. Mixed hyperlipidemia Carly [name]'s 10/18/2019 Lipid panel resulted low HDL with elevated triglycerides and LDL. She is not on statin therapy.  5. At risk for heart disease Carly [name] is at a higher than average risk for  cardiovascular disease due to obesity, hyperlipidemia, and pre-diabetes.  Assessment/Plan:   1. Essential hypertension Carly [name] is working on healthy weight loss and exercise to improve blood pressure control. We will watch for signs of hypotension as she continues her lifestyle modifications.   - hydrochlorothiazide (MICROZIDE) 12.5 MG capsule; Take 1 capsule (12.5 mg total) by mouth daily.  Dispense: 30 capsule; Refill: 0 - Comprehensive metabolic panel  2. Pre-diabetes Carly [name] will continue to work on weight loss, exercise, and decreasing simple carbohydrates to help decrease the risk of diabetes. Check labs today.  - Hemoglobin A1c - Insulin, random  3. Vitamin D deficiency Low Vitamin D level contributes to fatigue and are associated with obesity, breast, and colon cancer. She agrees to continue to take OTC Vitamin D @1 ,000 IU QD and will follow-up for routine testing of Vitamin D, at least 2-3 times per year to avoid over-replacement. Check labs today.  - VITAMIN D 25 Hydroxy (Vit-D Deficiency, Fractures)  4. Mixed hyperlipidemia Cardiovascular risk and specific lipid/LDL goals reviewed.  We discussed several lifestyle modifications today and Carly [name] will continue to work on diet, exercise and weight loss efforts. Orders and follow up as documented in patient record. Check labs today.  Counseling Intensive lifestyle modifications are the first line treatment for this issue. . Dietary changes: Increase soluble fiber. Decrease simple carbohydrates. . Exercise changes: Moderate to vigorous-intensity aerobic activity 150 minutes per week if tolerated. . Lipid-lowering medications: see documented in medical record.  - Lipid panel  5. At risk for  heart disease Carly [name] was given approximately 15 minutes of coronary artery disease prevention counseling today. She is 50 y.o. female and has risk factors for heart disease including obesity. We discussed intensive lifestyle modifications today with  an emphasis on specific weight loss instructions and strategies.   Repetitive spaced learning was employed today to elicit superior memory formation and behavioral change.  6. Class 3 severe obesity with serious comorbidity and body mass index (BMI) of 40.0 to 44.9 in adult, unspecified obesity type Carly [name]) Carly [name] is currently in the action stage of change. As such, her goal is to continue with weight loss efforts. She has agreed to keeping a food journal and adhering to recommended goals of 1600 calories and 100 g protein.   We discussed various medication options to help Carly [name] with her weight loss efforts and we both agreed to continue [place], as per below. - Liraglutide -Weight Management (SAXENDA) 18 MG/3ML SOPN; Inject 3 mg into the skin daily. Pt knows to inject 2.4mg  once daily  Dispense: 15 mL; Refill: 0  Exercise goals: increase daily walking  Behavioral modification strategies: increasing lean protein intake, decreasing simple carbohydrates, meal planning and cooking strategies, planning for success and keeping a strict food journal.  Carly [name] has agreed to follow-up with our clinic in 3 weeks. She was informed of the importance of frequent follow-up visits to maximize her success with intensive lifestyle modifications for her multiple health conditions.   Carly [name] was informed we would discuss her lab results at her next visit unless there is a critical issue that needs to be addressed sooner. Carly [name] agreed to keep her next visit at the agreed upon time to discuss these results.  Objective:   Blood pressure 138/84, pulse 88, temperature (!) 97.4 F (36.3 C), height 5\' 4"  (1.626 m), weight 254 lb (115.2 kg), SpO2 99 %. Body mass index is 43.6 kg/m.  General: Cooperative, alert, well developed, in no acute distress. HEENT: Conjunctivae and lids unremarkable. Cardiovascular: Regular rhythm.  Lungs: Normal work of breathing. Neurologic: No focal deficits.   Lab Results  Component Value  Date   CREATININE 0.79 03/31/2020   BUN 13 03/31/2020   NA 141 03/31/2020   K 4.2 03/31/2020   CL 102 03/31/2020   CO2 22 03/31/2020   Lab Results  Component Value Date   ALT 31 03/31/2020   AST 22 03/31/2020   ALKPHOS 110 03/31/2020   BILITOT 0.2 03/31/2020   Lab Results  Component Value Date   HGBA1C 6.0 (H) 03/31/2020   HGBA1C 5.6 10/18/2019   HGBA1C 5.6 06/06/2019   HGBA1C 5.9 (H) 02/21/2019   HGBA1C 6.0 (H) 11/14/2018   Lab Results  Component Value Date   INSULIN WILL FOLLOW 03/31/2020   INSULIN 38.6 (H) 10/18/2019   INSULIN 23.3 06/06/2019   INSULIN 43.3 (H) 11/14/2018   Lab Results  Component Value Date   TSH 1.150 10/18/2019   Lab Results  Component Value Date   CHOL 195 03/31/2020   HDL 35 (L) 03/31/2020   LDLCALC 132 (H) 03/31/2020   TRIG 154 (H) 03/31/2020   CHOLHDL 5.6 (H) 03/31/2020   Lab Results  Component Value Date   WBC 8.2 02/21/2019   HGB 14.1 02/21/2019   HCT 42.9 02/21/2019   MCV 87 02/21/2019   PLT 312 02/21/2019     Attestation Statements:   Reviewed by clinician on day of visit: allergies, medications, problem list, medical history, surgical history, family history, social history, and previous encounter notes.  I,  Kathlene November, am acting as transcriptionist for Mina Marble, NP.  I have reviewed the above documentation for accuracy and completeness, and I agree with the above. -  Vimal Derego d. Kelle Ruppert, NP-C

## 2020-04-08 ENCOUNTER — Encounter (INDEPENDENT_AMBULATORY_CARE_PROVIDER_SITE_OTHER): Payer: Self-pay | Admitting: Adult Health

## 2020-04-08 NOTE — Telephone Encounter (Signed)
FYI

## 2020-04-11 ENCOUNTER — Other Ambulatory Visit: Payer: Self-pay | Admitting: Obstetrics and Gynecology

## 2020-04-11 NOTE — Telephone Encounter (Signed)
Annual exam scheduled on 06/12/20

## 2020-04-14 ENCOUNTER — Other Ambulatory Visit: Payer: Self-pay | Admitting: Internal Medicine

## 2020-04-23 ENCOUNTER — Ambulatory Visit (INDEPENDENT_AMBULATORY_CARE_PROVIDER_SITE_OTHER): Payer: BC Managed Care – PPO | Admitting: Adult Health

## 2020-04-27 ENCOUNTER — Other Ambulatory Visit (INDEPENDENT_AMBULATORY_CARE_PROVIDER_SITE_OTHER): Payer: Self-pay | Admitting: Adult Health

## 2020-04-27 DIAGNOSIS — R7303 Prediabetes: Secondary | ICD-10-CM

## 2020-04-29 ENCOUNTER — Other Ambulatory Visit (INDEPENDENT_AMBULATORY_CARE_PROVIDER_SITE_OTHER): Payer: Self-pay | Admitting: Adult Health

## 2020-04-29 DIAGNOSIS — I10 Essential (primary) hypertension: Secondary | ICD-10-CM

## 2020-05-07 ENCOUNTER — Other Ambulatory Visit: Payer: Self-pay | Admitting: Nurse Practitioner

## 2020-05-15 ENCOUNTER — Other Ambulatory Visit: Payer: Self-pay | Admitting: Internal Medicine

## 2020-05-27 ENCOUNTER — Other Ambulatory Visit (INDEPENDENT_AMBULATORY_CARE_PROVIDER_SITE_OTHER): Payer: Self-pay | Admitting: Physician Assistant

## 2020-05-27 DIAGNOSIS — I10 Essential (primary) hypertension: Secondary | ICD-10-CM

## 2020-06-03 NOTE — Progress Notes (Signed)
50 y.o. [id number] Married White or Caucasian Not Hispanic or Latino female here for annual exam.  She is on POP. No dyspareunia.  Period Cycle (Days): 28 Period Duration (Days): 2-3 Period Pattern: Regular Menstrual Flow: Light Menstrual Control: Maxi pad Menstrual Control Change Freq (Hours): 4 Dysmenorrhea: None   She has mixed incontinence, worsened in the last year. Stress>urge. She is leaking small amounts ~1 x a day.  No bowel c/o.   Patient's last menstrual period was 06/02/2020.          Sexually active: Yes.    The current method of family planning is OCP (estrogen/progesterone). Exercising: No  Smoker:  No  Health Maintenance: Pap:  03/21/2019 Negative, Neg HPV History of abnormal Pap: Yes. H/O LEEP 2010. MMG:  08/02/2019 Density C, BiRads 0 Incomplete- See report in Epic. Colonoscopy: 04/19/16 normal repeat in 5 years   TDaP:  12/12/13 Gardasil: none   reports that she has never smoked. She has never used smokeless tobacco. She reports that she does not drink alcohol and does not use drugs. She is a Environmental consultant at [employer]. 2 kids, 2 grandchildren. Daughter will be a Equities trader at [employer]. Son lives ~1 hour away, his sons are 49 and 54 years old.   Past Medical History:  Diagnosis Date  . Back pain   . Depression   . Diverticulitis   . Gastritis   . GERD (gastroesophageal reflux disease)   . Hepatic steatosis   . Hypertension   . Internal hemorrhoids   . Prediabetes 03/21/2019  . Sleep apnea   . Tubular adenoma of colon   . Vitamin D deficiency     Past Surgical History:  Procedure Laterality Date  . CERVICAL BIOPSY  W/ LOOP ELECTRODE EXCISION     ~2010  . CHOLECYSTECTOMY    . MICROLARYNGOSCOPY WITH CO2 LASER AND EXCISION OF VOCAL CORD LESION    . SHOULDER ARTHROSCOPY      Current Outpatient Medications  Medication Sig Dispense Refill  . amitriptyline (ELAVIL) 25 MG tablet TAKE 2 TABLETS BY MOUTH AT BEDTIME 180 tablet 1  . b complex vitamins tablet  Take 1 tablet by mouth daily.    . BD PEN NEEDLE NANO 2ND GEN 32G X 4 MM MISC USE AS DIRECTED 100 each 0  . escitalopram (LEXAPRO) 10 MG tablet Take 10 mg by mouth daily.    Marland Kitchen esomeprazole (NEXIUM) 40 MG capsule TAKE 1 CAPSULE BY MOUTH EVERY DAY BEFORE BREAKFAST 90 capsule 0  . hydrochlorothiazide (MICROZIDE) 12.5 MG capsule Take 1 capsule (12.5 mg total) by mouth daily. 30 capsule 0  . Liraglutide -Weight Management (SAXENDA) 18 MG/3ML SOPN Inject 3 mg into the skin daily. Pt knows to inject 2.4mg  once daily 15 mL 0  . meclizine (ANTIVERT) 25 MG tablet Take 1 tablet by mouth as needed.    . metFORMIN (GLUCOPHAGE) 1000 MG tablet Take 1 tablet (1,000 mg total) by mouth daily. 30 tablet 0  . norethindrone (MICRONOR) 0.35 MG tablet TAKE 1 TABLET BY MOUTH EVERY DAY 84 tablet 0  . triamcinolone ointment (KENALOG) 0.1 % as needed.    Marland Kitchen VITAMIN D, CHOLECALCIFEROL, PO Take 1,000 Int'l Units/day by mouth daily.     Current Facility-Administered Medications  Medication Dose Route Frequency Provider Last Rate Last Admin  . 0.9 %  sodium chloride infusion  500 mL Intravenous Once Pyrtle, Lajuan Lines, MD        Family History  Problem Relation Age of Onset  .  Diabetes Father   . Lung cancer Father   . Bladder Cancer Father   . Heart disease Father   . Hypertension Father   . Hyperlipidemia Father   . Stroke Father   . Obesity Father   . Depression Mother   . Diabetes Maternal Grandmother   . Diabetes Paternal Grandmother   . Colon cancer Paternal Aunt 68  . Esophageal cancer Neg Hx   . Rectal cancer Neg Hx   . Stomach cancer Neg Hx     Review of Systems  All other systems reviewed and are negative.   Exam:   BP 130/82   Pulse 89   Ht 5\' 4"  (1.626 m)   Wt 264 lb (119.7 kg)   LMP 06/02/2020   SpO2 99%   BMI 45.32 kg/m   Weight change: @WEIGHTCHANGE @ Height:   Height: 5\' 4"  (162.6 cm)  Ht Readings from Last 3 Encounters:  06/12/20 5\' 4"  (1.626 m)  03/31/20 5\' 4"  (1.626 m)  01/09/20 5'  4" (1.626 m)    General appearance: alert, cooperative and appears stated age Head: Normocephalic, without obvious abnormality, atraumatic Neck: no adenopathy, supple, symmetrical, trachea midline and thyroid normal to inspection and palpation Lungs: clear to auscultation bilaterally Cardiovascular: regular rate and rhythm Breasts: normal appearance, no masses or tenderness Abdomen: soft, non-tender; non distended,  no masses,  no organomegaly Extremities: extremities normal, atraumatic, no cyanosis or edema Skin: Skin color, texture, turgor normal. No rashes or lesions Lymph nodes: Cervical, supraclavicular, and axillary nodes normal. No abnormal inguinal nodes palpated Neurologic: Grossly normal   Pelvic: External genitalia:  no lesions              Urethra:  normal appearing urethra with no masses, tenderness or lesions              Bartholins and Skenes: normal                 Vagina: normal appearing vagina with normal color and discharge, no lesions              Cervix: no lesions               Bimanual Exam:  Uterus:  no masses or tenderness              Adnexa: no mass, fullness, tenderness               Rectovaginal: Confirms               Anus:  normal sphincter tone, no lesions  Gae Dry chaperoned for the exam.  1. Well woman exam Discussed breast self exam Discussed calcium and vit D intake No pap this year Colonoscopy next year Mammogram in 7/22 Labs with primary   2. BMI 45.0-49.9, adult [facility]) Discussed eating healthy, exercise She is considering weight watchers.   3. Encounter for surveillance of contraceptive pills Doing well - norethindrone (MICRONOR) 0.35 MG tablet; Take 1 tablet (0.35 mg total) by mouth daily.  Dispense: 84 tablet; Refill: 3  4. Mixed incontinence Discussed kegels, information given. Discussed the option of PT, medication and surgery. Information given

## 2020-06-05 ENCOUNTER — Encounter (INDEPENDENT_AMBULATORY_CARE_PROVIDER_SITE_OTHER): Payer: Self-pay

## 2020-06-12 ENCOUNTER — Encounter: Payer: Self-pay | Admitting: Obstetrics and Gynecology

## 2020-06-12 ENCOUNTER — Other Ambulatory Visit: Payer: Self-pay

## 2020-06-12 ENCOUNTER — Ambulatory Visit (INDEPENDENT_AMBULATORY_CARE_PROVIDER_SITE_OTHER): Payer: BC Managed Care – PPO | Admitting: Obstetrics and Gynecology

## 2020-06-12 VITALS — BP 130/82 | HR 89 | Ht 64.0 in | Wt 264.0 lb

## 2020-06-12 DIAGNOSIS — Z3041 Encounter for surveillance of contraceptive pills: Secondary | ICD-10-CM | POA: Diagnosis not present

## 2020-06-12 DIAGNOSIS — N3946 Mixed incontinence: Secondary | ICD-10-CM

## 2020-06-12 DIAGNOSIS — Z6841 Body Mass Index (BMI) 40.0 and over, adult: Secondary | ICD-10-CM | POA: Diagnosis not present

## 2020-06-12 DIAGNOSIS — Z01419 Encounter for gynecological examination (general) (routine) without abnormal findings: Secondary | ICD-10-CM

## 2020-06-12 MED ORDER — NORETHINDRONE 0.35 MG PO TABS: 1.0000 | ORAL_TABLET | Freq: Every day | ORAL | 3 refills | Status: DC

## 2020-06-12 NOTE — Patient Instructions (Signed)
EXERCISE   We recommended that you start or continue a regular exercise program for good health. Physical activity is anything that gets your body moving, some is better than none. The CDC recommends 150 minutes per week of Moderate-Intensity Aerobic Activity and 2 or more days of Muscle Strengthening Activity.  Benefits of exercise are limitless: helps weight loss/weight maintenance, improves mood and energy, helps with depression and anxiety, improves sleep, tones and strengthens muscles, improves balance, improves bone density, protects from chronic conditions such as heart disease, high blood pressure and diabetes and so much more. To learn more visit: https://www.cdc.gov/physicalactivity/index.html  DIET: Good nutrition starts with a healthy diet of fruits, vegetables, whole grains, and lean protein sources. Drink plenty of water for hydration. Minimize empty calories, sodium, sweets. For more information about dietary recommendations visit: https://health.gov/our-work/nutrition-physical-activity/dietary-guidelines and https://www.myplate.gov/  ALCOHOL:  Women should limit their alcohol intake to no more than 7 drinks/beers/glasses of wine (combined, not each!) per week. Moderation of alcohol intake to this level decreases your risk of breast cancer and liver damage.  If you are concerned that you may have a problem, or your friends have told you they are concerned about your drinking, there are many resources to help. A well-known program that is free, effective, and available to all people all over the nation is Alcoholics Anonymous.  Check out this site to learn more: https://www.aa.org/   CALCIUM AND VITAMIN D:  Adequate intake of calcium and Vitamin D are recommended for bone health.  You should be getting between 1000-1200 mg of calcium and 800 units of Vitamin D daily between diet and supplements  PAP SMEARS:  Pap smears, to check for cervical cancer or precancers,  have traditionally been  done yearly, scientific advances have shown that most women can have pap smears less often.  However, every woman still should have a physical exam from her gynecologist every year. It will include a breast check, inspection of the vulva and vagina to check for abnormal growths or skin changes, a visual exam of the cervix, and then an exam to evaluate the size and shape of the uterus and ovaries. We will also provide age appropriate advice regarding health maintenance, like when you should have certain vaccines, screening for sexually transmitted diseases, bone density testing, colonoscopy, mammograms, etc.   MAMMOGRAMS:  All women over 40 years old should have a routine mammogram.   COLON CANCER SCREENING: Now recommend starting at age 45. At this time colonoscopy is not covered for routine screening until 50. There are take home tests that can be done between 45-49.   COLONOSCOPY:  Colonoscopy to screen for colon cancer is recommended for all women at age 50.  We know, you hate the idea of the prep.  We agree, BUT, having colon cancer and not knowing it is worse!!  Colon cancer so often starts as a polyp that can be seen and removed at colonscopy, which can quite literally save your life!  And if your first colonoscopy is normal and you have no family history of colon cancer, most women don't have to have it again for 10 years.  Once every ten years, you can do something that may end up saving your life, right?  We will be happy to help you get it scheduled when you are ready.  Be sure to check your insurance coverage so you understand how much it will cost.  It may be covered as a preventative service at no cost, but you should check   your particular policy.      Breast Self-Awareness Breast self-awareness means being familiar with how your breasts look and feel. It involves checking your breasts regularly and reporting any changes to your health care provider. Practicing breast self-awareness is  important. A change in your breasts can be a sign of a serious medical problem. Being familiar with how your breasts look and feel allows you to find any problems early, when treatment is more likely to be successful. All women should practice breast self-awareness, including women who have had breast implants. How to do a breast self-exam One way to learn what is normal for your breasts and whether your breasts are changing is to do a breast self-exam. To do a breast self-exam: Look for Changes  1. Remove all the clothing above your waist. 2. Stand in front of a mirror in a room with good lighting. 3. Put your hands on your hips. 4. Push your hands firmly downward. 5. Compare your breasts in the mirror. Look for differences between them (asymmetry), such as: ? Differences in shape. ? Differences in size. ? Puckers, dips, and bumps in one breast and not the other. 6. Look at each breast for changes in your skin, such as: ? Redness. ? Scaly areas. 7. Look for changes in your nipples, such as: ? Discharge. ? Bleeding. ? Dimpling. ? Redness. ? A change in position. Feel for Changes Carefully feel your breasts for lumps and changes. It is best to do this while lying on your back on the floor and again while sitting or standing in the shower or tub with soapy water on your skin. Feel each breast in the following way:  Place the arm on the side of the breast you are examining above your head.  Feel your breast with the other hand.  Start in the nipple area and make  inch (2 cm) overlapping circles to feel your breast. Use the pads of your three middle fingers to do this. Apply light pressure, then medium pressure, then firm pressure. The light pressure will allow you to feel the tissue closest to the skin. The medium pressure will allow you to feel the tissue that is a little deeper. The firm pressure will allow you to feel the tissue close to the ribs.  Continue the overlapping circles,  moving downward over the breast until you feel your ribs below your breast.  Move one finger-width toward the center of the body. Continue to use the  inch (2 cm) overlapping circles to feel your breast as you move slowly up toward your collarbone.  Continue the up and down exam using all three pressures until you reach your armpit.  Write Down What You Find  Write down what is normal for each breast and any changes that you find. Keep a written record with breast changes or normal findings for each breast. By writing this information down, you do not need to depend only on memory for size, tenderness, or location. Write down where you are in your menstrual cycle, if you are still menstruating. If you are having trouble noticing differences in your breasts, do not get discouraged. With time you will become more familiar with the variations in your breasts and more comfortable with the exam. How often should I examine my breasts? Examine your breasts every month. If you are breastfeeding, the best time to examine your breasts is after a feeding or after using a breast pump. If you menstruate, the best time to   examine your breasts is 5-7 days after your period is over. During your period, your breasts are lumpier, and it may be more difficult to notice changes. When should I see my health care provider? See your health care provider if you notice:  A change in shape or size of your breasts or nipples.  A change in the skin of your breast or nipples, such as a reddened or scaly area.  Unusual discharge from your nipples.  A lump or thick area that was not there before.  Pain in your breasts.  Anything that concerns you.   Kegel Exercises  Kegel exercises can help strengthen your pelvic floor muscles. The pelvic floor is a group of muscles that support your rectum, small intestine, and bladder. In females, pelvic floor muscles also help support the womb (uterus). These muscles help you  control the flow of urine and stool. Kegel exercises are painless and simple, and they do not require any equipment. Your provider may suggest Kegel exercises to:  Improve bladder and bowel control.  Improve sexual response.  Improve weak pelvic floor muscles after surgery to remove the uterus (hysterectomy) or pregnancy (females).  Improve weak pelvic floor muscles after prostate gland removal or surgery (males). Kegel exercises involve squeezing your pelvic floor muscles, which are the same muscles you squeeze when you try to stop the flow of urine or keep from passing gas. The exercises can be done while sitting, standing, or lying down, but it is best to vary your position. Exercises How to do Kegel exercises: 1. Squeeze your pelvic floor muscles tight. You should feel a tight lift in your rectal area. If you are a female, you should also feel a tightness in your vaginal area. Keep your stomach, buttocks, and legs relaxed. 2. Hold the muscles tight for up to 10 seconds. 3. Breathe normally. 4. Relax your muscles. 5. Repeat as told by your health care provider. Repeat this exercise daily as told by your health care provider. Continue to do this exercise for at least 4-6 weeks, or for as long as told by your health care provider. You may be referred to a physical therapist who can help you learn more about how to do Kegel exercises. Depending on your condition, your health care provider may recommend:  Varying how long you squeeze your muscles.  Doing several sets of exercises every day.  Doing exercises for several weeks.  Making Kegel exercises a part of your regular exercise routine. This information is not intended to replace advice given to you by your health care provider. Make sure you discuss any questions you have with your health care provider. Document Revised: 05/11/2019 Document Reviewed: 08/24/2017 Elsevier Patient Education  2021 [place]. Urinary  Incontinence  Urinary incontinence refers to a condition in which a person is unable to control where and when to pass urine. A person with this condition will urinate when he or she does not mean to (involuntarily). What are the causes? This condition may be caused by:  Medicines.  Infections.  Constipation.  Overactive bladder muscles.  Weak bladder muscles.  Weak pelvic floor muscles. These muscles provide support for the bladder, intestine, and, in women, the uterus.  Enlarged prostate in men. The prostate is a gland near the bladder. When it gets too big, it can pinch the urethra. With the urethra blocked, the bladder can weaken and lose the ability to empty properly.  Surgery.  Emotional factors, such as anxiety, stress, or post-traumatic stress  disorder (PTSD).  Pelvic organ prolapse. This happens in women when organs shift out of place and into the vagina. This shift can prevent the bladder and urethra from working properly. What increases the risk? The following factors may make you more likely to develop this condition:  Older age.  Obesity and physical inactivity.  Pregnancy and childbirth.  Menopause.  Diseases that affect the nerves or spinal cord (neurological diseases).  Long-term (chronic) coughing. This can increase pressure on the bladder and pelvic floor muscles. What are the signs or symptoms? Symptoms may vary depending on the type of urinary incontinence you have. They include:  A sudden urge to urinate, but passing urine involuntarily before you can get to a bathroom (urge incontinence).  Suddenly passing urine with any activity that forces urine to pass, such as coughing, laughing, exercise, or sneezing (stress incontinence).  Needing to urinate often, but urinating only a small amount, or constantly dribbling urine (overflow incontinence).  Urinating because you cannot get to the bathroom in time due to a physical disability, such as arthritis  or injury, or communication and thinking problems, such as Alzheimer disease (functional incontinence). How is this diagnosed? This condition may be diagnosed based on:  Your medical history.  A physical exam.  Tests, such as: ? Urine tests. ? X-rays of your kidney and bladder. ? Ultrasound. ? CT scan. ? Cystoscopy. In this procedure, a health care provider inserts a tube with a light and camera (cystoscope) through the urethra and into the bladder in order to check for problems. ? Urodynamic testing. These tests assess how well the bladder, urethra, and sphincter can store and release urine. There are different types of urodynamic tests, and they vary depending on what the test is measuring. To help diagnose your condition, your health care provider may recommend that you keep a log of when you urinate and how much you urinate. How is this treated? Treatment for this condition depends on the type of incontinence that you have and its cause. Treatment may include:  Lifestyle changes, such as: ? Quitting smoking. ? Maintaining a healthy weight. ? Staying active. Try to get 150 minutes of moderate-intensity exercise every week. Ask your health care provider which activities are safe for you. ? Eating a healthy diet.  Avoid high-fat foods, like fried foods.  Avoid refined carbohydrates like white bread and white rice.  Limit how much alcohol and caffeine you drink.  Increase your fiber intake. Foods such as fresh fruits, vegetables, beans, and whole grains are healthy sources of fiber.  Pelvic floor muscle exercises.  Bladder training, such as lengthening the amount of time between bathroom breaks, or using the bathroom at regular intervals.  Using techniques to suppress bladder urges. This can include distraction techniques or controlled breathing exercises.  Medicines to relax the bladder muscles and prevent bladder spasms.  Medicines to help slow or prevent the growth of a  man's prostate.  Botox injections. These can help relax the bladder muscles.  Using pulses of electricity to help change bladder reflexes (electrical nerve stimulation).  For women, using a medical device to prevent urine leaks. This is a small, tampon-like, disposable device that is inserted into the urethra.  Injecting collagen or carbon beads (bulking agents) into the urinary sphincter. These can help thicken tissue and close the bladder opening.  Surgery. Follow these instructions at home: Lifestyle  Limit alcohol and caffeine. These can fill your bladder quickly and irritate it.  Keep yourself clean to help  prevent odors and skin damage. Ask your doctor about special skin creams and cleansers that can protect the skin from urine.  Consider wearing pads or adult diapers. Make sure to change them regularly, and always change them right after experiencing incontinence. General instructions  Take over-the-counter and prescription medicines only as told by your health care provider.  Use the bathroom about every 3-4 hours, even if you do not feel the need to urinate. Try to empty your bladder completely every time. After urinating, wait a minute. Then try to urinate again.  Make sure you are in a relaxed position while urinating.  If your incontinence is caused by nerve problems, keep a log of the medicines you take and the times you go to the bathroom.  Keep all follow-up visits as told by your health care provider. This is important. Contact a health care provider if:  You have pain that gets worse.  Your incontinence gets worse. Get help right away if:  You have a fever or chills.  You are unable to urinate.  You have redness in your groin area or down your legs. Summary  Urinary incontinence refers to a condition in which a person is unable to control where and when to pass urine.  This condition may be caused by medicines, infection, weak bladder muscles, weak  pelvic floor muscles, enlargement of the prostate (in men), or surgery.  The following factors increase your risk for developing this condition: older age, obesity, pregnancy and childbirth, menopause, neurological diseases, and chronic coughing.  There are several types of urinary incontinence. They include urge incontinence, stress incontinence, overflow incontinence, and functional incontinence.  This condition is usually treated first with lifestyle and behavioral changes, such as quitting smoking, eating a healthier diet, and doing regular pelvic floor exercises. Other treatment options include medicines, bulking agents, medical devices, electrical nerve stimulation, or surgery. This information is not intended to replace advice given to you by your health care provider. Make sure you discuss any questions you have with your health care provider. Document Revised: 01/14/2017 Document Reviewed: 04/15/2016 Elsevier Patient Education  [place].

## 2020-09-18 ENCOUNTER — Encounter: Payer: Self-pay | Admitting: Obstetrics and Gynecology

## 2020-09-18 ENCOUNTER — Telehealth: Payer: Self-pay | Admitting: *Deleted

## 2020-09-18 NOTE — Telephone Encounter (Signed)
Patient aware of recommendations.  

## 2020-09-18 NOTE — Telephone Encounter (Signed)
I would recommend that she take Ibuprofen around the clock for 5 days, if the bleeding doesn't improve she should be seen.

## 2020-09-18 NOTE — Telephone Encounter (Signed)
Patient states she is having a prolonged period x 2 weeks, changing saturated pad every 3 hrs should this be concerning. I will route this to provider for recommendations.Last annual 06/12/2020.

## 2020-11-05 HISTORY — PX: BARIATRIC SURGERY: SHX1103

## 2020-11-12 ENCOUNTER — Other Ambulatory Visit: Payer: Self-pay | Admitting: Nurse Practitioner

## 2020-11-17 ENCOUNTER — Other Ambulatory Visit: Payer: Self-pay

## 2021-01-15 DIAGNOSIS — Z9884 Bariatric surgery status: Secondary | ICD-10-CM | POA: Insufficient documentation

## 2021-02-11 ENCOUNTER — Other Ambulatory Visit: Payer: Self-pay | Admitting: Nurse Practitioner

## 2021-04-27 DIAGNOSIS — M2022 Hallux rigidus, left foot: Secondary | ICD-10-CM | POA: Insufficient documentation

## 2021-05-05 DIAGNOSIS — M25561 Pain in right knee: Secondary | ICD-10-CM | POA: Insufficient documentation

## 2021-05-05 DIAGNOSIS — M222X1 Patellofemoral disorders, right knee: Secondary | ICD-10-CM | POA: Insufficient documentation

## 2021-05-22 ENCOUNTER — Other Ambulatory Visit: Payer: Self-pay | Admitting: Obstetrics and Gynecology

## 2021-05-22 DIAGNOSIS — Z3041 Encounter for surveillance of contraceptive pills: Secondary | ICD-10-CM

## 2021-05-22 NOTE — Telephone Encounter (Signed)
Annual exam scheduled on 06/25/21 ?Last annual exam 06/12/2020 ?Last mammogram 07/2019  ?She had diagnostic imaging on 08/21/19 and has not had any follow since then. I called patient and asked her to please call and schedule her mammogram as she is very over due. Patient said she would.  ?

## 2021-06-17 ENCOUNTER — Ambulatory Visit: Payer: BC Managed Care – PPO | Admitting: Obstetrics and Gynecology

## 2021-06-17 NOTE — Progress Notes (Signed)
51 y.o. [id number] Married White or Caucasian Not Hispanic or Latino female here for annual exam. On POP, cycles are mostly monthly and tolerable. No dyspareunia.  Period Cycle (Days):  (sometimes can skip a month or come early) Period Duration (Days): 3-5 Menstrual Flow:  (varies) Menstrual Control: Maxi pad Dysmenorrhea: (!) Mild Dysmenorrhea Symptoms: Cramping  S/P gastric sleeve in 10/22, she has lost ~68 lbs.   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  Slightly worse in the last year, GSI>Urge. Leaks a small amount 1 x a day, wears a mini-pad.   Patient's last menstrual period was 06/22/2021 (exact date).          Sexually active: Yes.    The current method of family planning is oral progesterone-only contraceptive.    Exercising: Yes.     Walking/weights Smoker:  no  Health Maintenance: Pap:  03/22/2019 Negative, Neg HPV History of abnormal Pap:  yes  H/O LEEP 2010  MMG:  08/21/19 Bi-rads 3 probably benign ( care everywhere) a 6 mo f/u of left breast was recommended. Scheduled for 07/01/21. BMD: none  Colonoscopy: 04/19/16 normal repeat in 5 years   TDaP:  12/12/13 Gardasil: none   reports that she has never smoked. She has never used smokeless tobacco. She reports that she does not drink alcohol and does not use drugs. She is a Environmental consultant at [employer]. 2 kids, 2 grandchildren. Youngest just graduated from college.   Past Medical History:  Diagnosis Date   Back pain    Depression    Diverticulitis    Gastritis    GERD (gastroesophageal reflux disease)    Hepatic steatosis    Hypertension    Internal hemorrhoids    Prediabetes 03/21/2019   Sleep apnea    Tubular adenoma of colon    Vitamin D deficiency     Past Surgical History:  Procedure Laterality Date   BARIATRIC SURGERY  11/05/2020   Weight loss surgery ([facility])   CERVICAL BIOPSY  W/ LOOP ELECTRODE EXCISION     ~2010   CHOLECYSTECTOMY     MICROLARYNGOSCOPY WITH CO2 LASER AND EXCISION OF VOCAL CORD  LESION     SHOULDER ARTHROSCOPY      Current Outpatient Medications  Medication Sig Dispense Refill   amitriptyline (ELAVIL) 25 MG tablet TAKE 2 TABLETS BY MOUTH AT BEDTIME. NEED APPOINTMENT FOR REFILLS 180 tablet 0   b complex vitamins tablet Take 1 tablet by mouth daily.     diclofenac Sodium (VOLTAREN) 1 % GEL diclofenac 1 % topical gel  APPLY 2 GRAMS TO THE AFFECTED AREA(S) BY TOPICAL ROUTE 4 TIMES PER DAY     escitalopram (LEXAPRO) 10 MG tablet Take 10 mg by mouth daily.     esomeprazole (NEXIUM) 40 MG capsule TAKE 1 CAPSULE BY MOUTH EVERY DAY BEFORE BREAKFAST 90 capsule 0   lisinopril (ZESTRIL) 10 MG tablet Take 10 mg by mouth daily.     meclizine (ANTIVERT) 25 MG tablet Take 1 tablet by mouth as needed.     norethindrone (MICRONOR) 0.35 MG tablet TAKE 1 TABLET BY MOUTH EVERY DAY 84 tablet 0   tiZANidine (ZANAFLEX) 4 MG tablet tizanidine 4 mg tablet     triamcinolone ointment (KENALOG) 0.1 % as needed.     VITAMIN D, CHOLECALCIFEROL, PO Take 1,000 Int'l Units/day by mouth daily.     Current Facility-Administered Medications  Medication Dose Route Frequency Provider Last Rate Last Admin   0.9 %  sodium chloride infusion  500  mL Intravenous Once Pyrtle, Lajuan Lines, MD        Family History  Problem Relation Age of Onset   Diabetes Father    Lung cancer Father    Bladder Cancer Father    Heart disease Father    Hypertension Father    Hyperlipidemia Father    Stroke Father    Obesity Father    Depression Mother    Diabetes Maternal Grandmother    Diabetes Paternal Grandmother    Colon cancer Paternal Aunt 81   Esophageal cancer Neg Hx    Rectal cancer Neg Hx    Stomach cancer Neg Hx     Review of Systems  All other systems reviewed and are negative.   Exam:   BP 132/82   Pulse 65   Ht 5' 3.75" (1.619 m)   Wt 198 lb (89.8 kg)   LMP 06/22/2021 (Exact Date)   SpO2 99%   BMI 34.25 kg/m   Weight change: '@WEIGHTCHANGE'$ @ Height:   Height: 5' 3.75" (161.9 cm)  Ht Readings  from Last 3 Encounters:  06/25/21 5' 3.75" (1.619 m)  06/12/20 '5\' 4"'$  (1.626 m)  03/31/20 '5\' 4"'$  (1.626 m)    General appearance: alert, cooperative and appears stated age Head: Normocephalic, without obvious abnormality, atraumatic Neck: no adenopathy, supple, symmetrical, trachea midline and thyroid normal to inspection and palpation Lungs: clear to auscultation bilaterally Cardiovascular: regular rate and rhythm Breasts: normal appearance, no masses or tenderness Abdomen: soft, non-tender; non distended,  no masses,  no organomegaly Extremities: extremities normal, atraumatic, no cyanosis or edema Skin: Skin color, texture, turgor normal. No rashes or lesions Lymph nodes: Cervical, supraclavicular, and axillary nodes normal. No abnormal inguinal nodes palpated Neurologic: Grossly normal   Pelvic: External genitalia:  no lesions              Urethra:  normal appearing urethra with no masses, tenderness or lesions              Bartholins and Skenes: normal                 Vagina: normal appearing vagina with normal color and discharge, no lesions              Cervix: no lesions               Bimanual Exam:  Uterus:   no masses or tenderness              Adnexa: no mass, fullness, tenderness               Rectovaginal: Confirms               Anus:  normal sphincter tone, no lesions  Lovena Le, CMA chaperoned for the exam.  1. Well woman exam Discussed breast self exam Discussed calcium and vit D intake Pap next year Labs with primary Mammogram next week  2. Mixed incontinence - Ambulatory referral to Physical Therapy  3. Colon cancer screening - Ambulatory referral to Gastroenterology  4. Encounter for surveillance of contraceptive pills Doing well - norethindrone (MICRONOR) 0.35 MG tablet; Take 1 tablet (0.35 mg total) by mouth daily.  Dispense: 84 tablet; Refill: 3

## 2021-06-22 ENCOUNTER — Encounter: Payer: Self-pay | Admitting: Obstetrics and Gynecology

## 2021-06-25 ENCOUNTER — Ambulatory Visit (INDEPENDENT_AMBULATORY_CARE_PROVIDER_SITE_OTHER): Payer: BC Managed Care – PPO | Admitting: Obstetrics and Gynecology

## 2021-06-25 ENCOUNTER — Encounter: Payer: Self-pay | Admitting: Obstetrics and Gynecology

## 2021-06-25 VITALS — BP 132/82 | HR 65 | Ht 63.75 in | Wt 198.0 lb

## 2021-06-25 DIAGNOSIS — Z01419 Encounter for gynecological examination (general) (routine) without abnormal findings: Secondary | ICD-10-CM | POA: Diagnosis not present

## 2021-06-25 DIAGNOSIS — Z1211 Encounter for screening for malignant neoplasm of colon: Secondary | ICD-10-CM

## 2021-06-25 DIAGNOSIS — Z3041 Encounter for surveillance of contraceptive pills: Secondary | ICD-10-CM

## 2021-06-25 DIAGNOSIS — N3946 Mixed incontinence: Secondary | ICD-10-CM

## 2021-06-25 MED ORDER — NORETHINDRONE 0.35 MG PO TABS: 1.0000 | ORAL_TABLET | Freq: Every day | ORAL | 3 refills | Status: DC

## 2021-06-25 NOTE — Patient Instructions (Signed)

## 2021-07-08 ENCOUNTER — Encounter: Payer: Self-pay | Admitting: Internal Medicine

## 2021-07-08 ENCOUNTER — Encounter: Payer: Self-pay | Admitting: *Deleted

## 2021-08-09 ENCOUNTER — Encounter: Payer: Self-pay | Admitting: Obstetrics and Gynecology

## 2021-08-26 ENCOUNTER — Encounter (INDEPENDENT_AMBULATORY_CARE_PROVIDER_SITE_OTHER): Payer: Self-pay

## 2021-10-12 ENCOUNTER — Ambulatory Visit (AMBULATORY_SURGERY_CENTER): Payer: Self-pay

## 2021-10-12 VITALS — Ht 64.0 in | Wt 189.0 lb

## 2021-10-12 DIAGNOSIS — Z86010 Personal history of colonic polyps: Secondary | ICD-10-CM

## 2021-10-12 MED ORDER — NA SULFATE-K SULFATE-MG SULF 17.5-3.13-1.6 GM/177ML PO SOLN: 1.0000 | Freq: Once | ORAL | 0 refills | Status: AC

## 2021-10-12 NOTE — Progress Notes (Signed)
No egg or soy allergy known to patient  No issues known to pt with past sedation with any surgeries or procedures Patient denies ever being told they had issues or difficulty with intubation  No FH of Malignant Hyperthermia Pt is not on diet pills Pt is not on  home 02  Pt is not on blood thinners  Pt denies issues with constipation  No A fib or A flutter Have any cardiac testing pending--no Pt instructed to use [email or web address] or GoodRx for a price reduction on prep   

## 2021-10-16 ENCOUNTER — Encounter: Payer: Self-pay | Admitting: Internal Medicine

## 2021-11-02 ENCOUNTER — Encounter: Payer: Self-pay | Admitting: Internal Medicine

## 2021-11-02 ENCOUNTER — Ambulatory Visit (AMBULATORY_SURGERY_CENTER): Payer: BC Managed Care – PPO | Admitting: Internal Medicine

## 2021-11-02 VITALS — BP 124/71 | HR 66 | Temp 98.9°F | Resp 18 | Ht 64.0 in | Wt 189.0 lb

## 2021-11-02 DIAGNOSIS — D120 Benign neoplasm of cecum: Secondary | ICD-10-CM | POA: Diagnosis not present

## 2021-11-02 DIAGNOSIS — D124 Benign neoplasm of descending colon: Secondary | ICD-10-CM

## 2021-11-02 DIAGNOSIS — Z86010 Personal history of colonic polyps: Secondary | ICD-10-CM

## 2021-11-02 DIAGNOSIS — Z09 Encounter for follow-up examination after completed treatment for conditions other than malignant neoplasm: Secondary | ICD-10-CM

## 2021-11-02 DIAGNOSIS — K635 Polyp of colon: Secondary | ICD-10-CM

## 2021-11-02 MED ORDER — SODIUM CHLORIDE 0.9 % IV SOLN: 500.0000 mL | Freq: Once | INTRAVENOUS | Status: DC

## 2021-11-02 NOTE — Progress Notes (Signed)
Pt resting comfortably. VSS. Airway intact. SBAR complete to RN. All questions answered.   

## 2021-11-02 NOTE — Op Note (Signed)
[facility] Patient Name: Carly [name] Procedure Date: 11/02/2021 9:08 AM MRN: [id number] Endoscopist: Jerene Bears , MD Age: 51 Referring MD:  Date of Birth: 07-Jul-1970 Gender: Female Account #: [id number] Procedure:                Colonoscopy Indications:              High risk colon cancer surveillance: Personal                            history of non-advanced adenoma, Last colonoscopy:                            April 2018 Medicines:                Monitored Anesthesia Care Procedure:                Pre-Anesthesia Assessment:                           - Prior to the procedure, a History and Physical                            was performed, and patient medications and                            allergies were reviewed. The patient's tolerance of                            previous anesthesia was also reviewed. The risks                            and benefits of the procedure and the sedation                            options and risks were discussed with the patient.                            All questions were answered, and informed consent                            was obtained. Prior Anticoagulants: The patient has                            taken no previous anticoagulant or antiplatelet                            agents. ASA Grade Assessment: III - A patient with                            severe systemic disease. After reviewing the risks                            and benefits, the patient was deemed in  satisfactory condition to undergo the procedure.                           After obtaining informed consent, the colonoscope                            was passed under direct vision. Throughout the                            procedure, the patient's blood pressure, pulse, and                            oxygen saturations were monitored continuously. The                            Olympus PCF-H190DL ([id number]) Colonoscope was                             introduced through the anus and advanced to the                            cecum, identified by appendiceal orifice and                            ileocecal valve. The colonoscopy was performed                            without difficulty. The patient tolerated the                            procedure well. The quality of the bowel                            preparation was good. The ileocecal valve,                            appendiceal orifice, and rectum were photographed. Scope In: 9:23:05 AM Scope Out: 9:45:04 AM Scope Withdrawal Time: 0 hours 17 minutes 33 seconds  Total Procedure Duration: 0 hours 21 minutes 59 seconds  Findings:                 The digital rectal exam was normal.                           A 5 mm polyp was found in the cecum. The polyp was                            sessile. The polyp was removed with a cold snare.                            Resection and retrieval were complete.                           A 5 mm polyp was found in the descending colon. The  polyp was sessile. The polyp was removed with a                            cold snare. Resection and retrieval were complete.                           Multiple small and large-mouthed diverticula were                            found in the sigmoid colon, descending colon and                            ascending colon.                           Internal hemorrhoids were found during                            retroflexion. The hemorrhoids were small. Complications:            No immediate complications. Estimated Blood Loss:     Estimated blood loss: none. Impression:               - One 5 mm polyp in the cecum, removed with a cold                            snare. Resected and retrieved.                           - One 5 mm polyp in the descending colon, removed                            with a cold snare. Resected and retrieved.                            - Moderate diverticulosis in the sigmoid colon, in                            the descending colon and in the ascending colon.                           - Small internal hemorrhoids. Recommendation:           - Patient has a contact number available for                            emergencies. The signs and symptoms of potential                            delayed complications were discussed with the                            patient. Return to normal activities tomorrow.  Written discharge instructions were provided to the                            patient.                           - Resume previous diet.                           - Continue present medications.                           - Await pathology results.                           - Repeat colonoscopy is recommended for                            surveillance. The colonoscopy date will be                            determined after pathology results from today's                            exam become available for review. Jerene Bears, MD 11/02/2021 9:48:07 AM This report has been signed electronically.

## 2021-11-02 NOTE — Patient Instructions (Signed)
Information on polyps, diverticulosis and hemorrhoids given to you today.  Await pathology results.  Resume previous diet and medications.    YOU HAD AN ENDOSCOPIC PROCEDURE TODAY AT THE [facility]:   Refer to the procedure report that was given to you for any specific questions about what was found during the examination.  If the procedure report does not answer your questions, please call your gastroenterologist to clarify.  If you requested that your care partner not be given the details of your procedure findings, then the procedure report has been included in a sealed envelope for you to review at your convenience later.  YOU SHOULD EXPECT: Some feelings of bloating in the abdomen. Passage of more gas than usual.  Walking can help get rid of the air that was put into your GI tract during the procedure and reduce the bloating. If you had a lower endoscopy (such as a colonoscopy or flexible sigmoidoscopy) you may notice spotting of blood in your stool or on the toilet paper. If you underwent a bowel prep for your procedure, you may not have a normal bowel movement for a few days.  Please Note:  You might notice some irritation and congestion in your nose or some drainage.  This is from the oxygen used during your procedure.  There is no need for concern and it should clear up in a day or so.  SYMPTOMS TO REPORT IMMEDIATELY:  Following lower endoscopy (colonoscopy or flexible sigmoidoscopy):  Excessive amounts of blood in the stool  Significant tenderness or worsening of abdominal pains  Swelling of the abdomen that is new, acute  Fever of 100F or higher   For urgent or emergent issues, a gastroenterologist can be reached at any hour by calling [phone]. Do not use MyChart messaging for urgent concerns.    DIET:  We do recommend a small meal at first, but then you may proceed to your regular diet.  Drink plenty of fluids but you should avoid alcoholic beverages for  24 hours.  ACTIVITY:  You should plan to take it easy for the rest of today and you should NOT DRIVE or use heavy machinery until tomorrow (because of the sedation medicines used during the test).    FOLLOW UP: Our staff will call the number listed on your records the next business day following your procedure.  We will call around 7:15- 8:00 am to check on you and address any questions or concerns that you may have regarding the information given to you following your procedure. If we do not reach you, we will leave a message.     If any biopsies were taken you will be contacted by phone or by letter within the next 1-3 weeks.  Please call us at [phone] if you have not heard about the biopsies in 3 weeks.    SIGNATURES/CONFIDENTIALITY: You and/or your care partner have signed paperwork which will be entered into your electronic medical record.  These signatures attest to the fact that that the information above on your After Visit Summary has been reviewed and is understood.  Full responsibility of the confidentiality of this discharge information lies with you and/or your care-partner. 

## 2021-11-02 NOTE — Progress Notes (Signed)
GASTROENTEROLOGY PROCEDURE H&P NOTE   Primary Care Physician: Gennette Pac, NP    Reason for Procedure:  Personal history of adenomatous colon polyp  Plan:    Colonoscopy  Patient is appropriate for endoscopic procedure(s) in the ambulatory ([facility]) setting.  The nature of the procedure, as well as the risks, benefits, and alternatives were carefully and thoroughly reviewed with the patient. Ample time for discussion and questions allowed. The patient understood, was satisfied, and agreed to proceed.     HPI: Carly [name] is a 51 y.o. female who presents for surveillance colonoscopy.  Medical history as below.  Tolerated the prep.  No recent chest pain or shortness of breath.  No abdominal pain today.  Past Medical History:  Diagnosis Date   Back pain    Depression    Diverticulitis    Gastritis    GERD (gastroesophageal reflux disease)    Hepatic steatosis    Hypertension    Internal hemorrhoids    Prediabetes 03/21/2019   Sleep apnea 2012   uses cpap   Tubular adenoma of colon    Vitamin D deficiency     Past Surgical History:  Procedure Laterality Date   BARIATRIC SURGERY  11/05/2020   Weight loss surgery ([facility])   CERVICAL BIOPSY  W/ LOOP ELECTRODE EXCISION     ~2010   CHOLECYSTECTOMY     COLONOSCOPY     MICROLARYNGOSCOPY WITH CO2 LASER AND EXCISION OF VOCAL CORD LESION     POLYPECTOMY     SHOULDER ARTHROSCOPY Right 2019    Prior to Admission medications   Medication Sig Start Date End Date Taking? Authorizing Provider  buPROPion (WELLBUTRIN XL) 150 MG 24 hr tablet Take 150 mg by mouth every morning. 09/07/21  Yes [provider]  esomeprazole (NEXIUM) 40 MG capsule TAKE 1 CAPSULE BY MOUTH EVERY DAY BEFORE BREAKFAST 01/17/20  Yes Meredeth Furber, Lajuan Lines, MD  lisinopril (ZESTRIL) 10 MG tablet Take 10 mg by mouth daily. 06/05/21  Yes [provider]  norethindrone (MICRONOR) 0.35 MG tablet Take 1 tablet (0.35 mg total) by mouth daily. 06/25/21   Yes Salvadore Dom, MD  amitriptyline (ELAVIL) 25 MG tablet TAKE 2 TABLETS BY MOUTH AT BEDTIME. NEED APPOINTMENT FOR REFILLS Patient not taking: Reported on 10/12/2021 11/13/20   Noralyn Pick, NP  b complex vitamins tablet Take 1 tablet by mouth daily.    [provider]  diclofenac Sodium (VOLTAREN) 1 % GEL diclofenac 1 % topical gel  APPLY 2 GRAMS TO THE AFFECTED AREA(S) BY TOPICAL ROUTE 4 TIMES PER DAY    [provider]  meclizine (ANTIVERT) 25 MG tablet Take 1 tablet by mouth as needed. Patient not taking: Reported on 11/02/2021 08/09/15   [provider]  tiZANidine (ZANAFLEX) 4 MG tablet tizanidine 4 mg tablet 04/23/21   [provider]  triamcinolone ointment (KENALOG) 0.1 % as needed. 09/17/19   [provider]  VITAMIN D, CHOLECALCIFEROL, PO Take 1,000 Int'l Units/day by mouth daily.    [provider]    Current Outpatient Medications  Medication Sig Dispense Refill   buPROPion (WELLBUTRIN XL) 150 MG 24 hr tablet Take 150 mg by mouth every morning.     esomeprazole (NEXIUM) 40 MG capsule TAKE 1 CAPSULE BY MOUTH EVERY DAY BEFORE BREAKFAST 90 capsule 0   lisinopril (ZESTRIL) 10 MG tablet Take 10 mg by mouth daily.     norethindrone (MICRONOR) 0.35 MG tablet Take 1 tablet (0.35 mg total) by  mouth daily. 84 tablet 3   amitriptyline (ELAVIL) 25 MG tablet TAKE 2 TABLETS BY MOUTH AT BEDTIME. NEED APPOINTMENT FOR REFILLS (Patient not taking: Reported on 10/12/2021) 180 tablet 0   b complex vitamins tablet Take 1 tablet by mouth daily.     diclofenac Sodium (VOLTAREN) 1 % GEL diclofenac 1 % topical gel  APPLY 2 GRAMS TO THE AFFECTED AREA(S) BY TOPICAL ROUTE 4 TIMES PER DAY     meclizine (ANTIVERT) 25 MG tablet Take 1 tablet by mouth as needed. (Patient not taking: Reported on 11/02/2021)     tiZANidine (ZANAFLEX) 4 MG tablet tizanidine 4 mg tablet     triamcinolone ointment (KENALOG) 0.1 % as needed.     VITAMIN D,  CHOLECALCIFEROL, PO Take 1,000 Int'l Units/day by mouth daily.     Current Facility-Administered Medications  Medication Dose Route Frequency Provider Last Rate Last Admin   0.9 %  sodium chloride infusion  500 mL Intravenous Once Matyas Baisley, Lajuan Lines, MD       0.9 %  sodium chloride infusion  500 mL Intravenous Once Aveleen Nevers, Lajuan Lines, MD        Allergies as of 11/02/2021 - Review Complete 11/02/2021  Allergen Reaction Noted   Cephalosporins Rash 04/26/2012    Family History  Problem Relation Age of Onset   Depression Mother    Diabetes Father    Lung cancer Father    Bladder Cancer Father    Heart disease Father    Hypertension Father    Hyperlipidemia Father    Stroke Father    Obesity Father    Colon polyps Paternal Aunt    Colon cancer Paternal Aunt 68   Diabetes Maternal Grandmother    Diabetes Paternal Grandmother    Esophageal cancer Neg Hx    Rectal cancer Neg Hx    Stomach cancer Neg Hx     Social History   Socioeconomic History   Marital status: Married    Spouse name: Hanaa Payes   Number of children: 2   Years of education: Not on file   Highest education level: Not on file  Occupational History   Occupation: Environmental consultant   Tobacco Use   Smoking status: Never   Smokeless tobacco: Never  Vaping Use   Vaping Use: Never used  Substance and Sexual Activity   Alcohol use: Never   Drug use: Never   Sexual activity: Yes    Partners: Male    Birth control/protection: Pill  Other Topics Concern   Not on file  Social History Narrative   Not on file   Social Determinants of Health   Financial Resource Strain: Not on file  Food Insecurity: Not on file  Transportation Needs: Not on file  Physical Activity: Not on file  Stress: Not on file  Social Connections: Not on file  Intimate Partner Violence: Not on file    Physical Exam: Vital signs in last 24 hours: '@BP'$  127/72   Pulse 73   Temp 98.9 F (37.2 C) (Skin)   Ht '5\' 4"'$  (1.626 m)   Wt 189 lb (85.7  kg)   LMP 10/05/2021 (Exact Date)   SpO2 100%   BMI 32.44 kg/m  GEN: NAD EYE: Sclerae anicteric ENT: MMM CV: Non-tachycardic Pulm: CTA b/l GI: Soft, NT/ND NEURO:  Alert & Oriented x 3   Zenovia Jarred, MD [facility]  11/02/2021 9:12 AM

## 2021-11-02 NOTE — Progress Notes (Signed)
VS by [place]  Pt's states no medical or surgical changes since previsit or office visit.  

## 2021-11-03 ENCOUNTER — Telehealth: Payer: Self-pay | Admitting: *Deleted

## 2021-11-03 NOTE — Telephone Encounter (Signed)
Left message on f/u call 

## 2021-11-05 ENCOUNTER — Encounter: Payer: Self-pay | Admitting: Internal Medicine

## 2022-06-22 NOTE — Progress Notes (Signed)
52 y.o. [id number] Married White or Caucasian Not Hispanic or Latino female here for annual exam.  On POP's. No dyspareunia.  Period Cycle (Days): 28 Period Duration (Days): 2-3 Period Pattern: Regular Menstrual Flow: Moderate Menstrual Control: Maxi pad Menstrual Control Change Freq (Hours): 3-4 Dysmenorrhea: (!) Mild Dysmenorrhea Symptoms: Cramping  H/O gastric sleeve in 10/22, she has lost ~93 lbs total.   H/O mixed urinary incontinence. Improved on medication.   Patient's last menstrual period was 06/08/2022 (approximate).          Sexually active: Yes.    The current method of family planning is oral progesterone-only contraceptive.    Exercising: Yes.     Walking  Smoker:  no  Health Maintenance: Pap:  03/22/2019 Negative, Neg HPV History of abnormal Pap:  yes  H/O LEEP 2010  MMG:  07/01/21 Bi-rads 2 benign (care everywhere)  BMD:   none  Colonoscopy: 11/02/21, + polyps, f/u 5 years  TDaP:  12/18/13 Gardasil: n/a   reports that she has never smoked. She has never used smokeless tobacco. She reports that she does not drink alcohol and does not use drugs. 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 2 kids, 2 grandchildren (almost 6 and 4).   Past Medical History:  Diagnosis Date   Back pain    Depression    Diverticulitis    Gastritis    GERD (gastroesophageal reflux disease)    Hepatic steatosis    Hypertension    Internal hemorrhoids    Prediabetes 03/21/2019   Sleep apnea 2012   uses cpap   Tubular adenoma of colon    Vitamin D deficiency     Past Surgical History:  Procedure Laterality Date   BARIATRIC SURGERY  11/05/2020   Weight loss surgery (Sadie)   CERVICAL BIOPSY  W/ LOOP ELECTRODE EXCISION     ~2010   CHOLECYSTECTOMY     COLONOSCOPY     MICROLARYNGOSCOPY WITH CO2 LASER AND EXCISION OF VOCAL CORD LESION     POLYPECTOMY     SHOULDER ARTHROSCOPY Right 2019    Current Outpatient Medications  Medication Sig Dispense Refill   b complex vitamins tablet Take 1  tablet by mouth daily.     buPROPion (WELLBUTRIN XL) 150 MG 24 hr tablet Take 150 mg by mouth every morning.     diclofenac Sodium (VOLTAREN) 1 % GEL diclofenac 1 % topical gel  APPLY 2 GRAMS TO THE AFFECTED AREA(S) BY TOPICAL ROUTE 4 TIMES PER DAY     esomeprazole (NEXIUM) 40 MG capsule TAKE 1 CAPSULE BY MOUTH EVERY DAY BEFORE BREAKFAST 90 capsule 0   lisinopril (ZESTRIL) 10 MG tablet Take 10 mg by mouth daily.     meclizine (ANTIVERT) 25 MG tablet Take 1 tablet by mouth as needed.     norethindrone (MICRONOR) 0.35 MG tablet Take 1 tablet (0.35 mg total) by mouth daily. 84 tablet 3   tiZANidine (ZANAFLEX) 4 MG tablet tizanidine 4 mg tablet     triamcinolone ointment (KENALOG) 0.1 % as needed.     VITAMIN D, CHOLECALCIFEROL, PO Take 1,000 Int'l Units/day by mouth daily.     Current Facility-Administered Medications  Medication Dose Route Frequency Provider Last Rate Last Admin   0.9 %  sodium chloride infusion  500 mL Intravenous Once Pyrtle, Carie Caddy, MD        Family History  Problem Relation Age of Onset   Depression Mother    Diabetes Father    Lung cancer Father  Bladder Cancer Father    Heart disease Father    Hypertension Father    Hyperlipidemia Father    Stroke Father    Obesity Father    Colon polyps Paternal Aunt    Colon cancer Paternal Aunt 68   Diabetes Maternal Grandmother    Diabetes Paternal Grandmother    Esophageal cancer Neg Hx    Rectal cancer Neg Hx    Stomach cancer Neg Hx     Review of Systems  All other systems reviewed and are negative.   Exam:   BP 110/62   Pulse 78   Ht 5' 4.25" (1.632 m)   Wt 174 lb (78.9 kg)   LMP 06/08/2022 (Approximate)   SpO2 100%   BMI 29.64 kg/m   Weight change: @WEIGHTCHANGE @ Height:   Height: 5' 4.25" (163.2 cm)  Ht Readings from Last 3 Encounters:  06/30/22 5' 4.25" (1.632 m)  11/02/21 5\' 4"  (1.626 m)  10/12/21 5\' 4"  (1.626 m)    General appearance: alert, cooperative and appears stated age Head:  Normocephalic, without obvious abnormality, atraumatic Neck: no adenopathy, supple, symmetrical, trachea midline and thyroid normal to inspection and palpation Lungs: clear to auscultation bilaterally Cardiovascular: regular rate and rhythm Breasts: normal appearance, no masses or tenderness Abdomen: soft, non-tender; non distended,  no masses,  no organomegaly Extremities: extremities normal, atraumatic, no cyanosis or edema Skin: Skin color, texture, turgor normal. No rashes or lesions Lymph nodes: Cervical, supraclavicular, and axillary nodes normal. No abnormal inguinal nodes palpated Neurologic: Grossly normal   Pelvic: External genitalia:  no lesions              Urethra:  normal appearing urethra with no masses, tenderness or lesions              Bartholins and Skenes: normal                 Vagina: normal appearing vagina with normal color and discharge, no lesions              Cervix: no lesions               Bimanual Exam:  Uterus:  normal size, contour, position, consistency, mobility, non-tender and anteverted              Adnexa: no mass, fullness, tenderness               Rectovaginal: Confirms               Anus:  normal sphincter tone, no lesions  Carolynn Serve, CMA chaperoned for the exam.  1. Well woman exam Discussed mammogram screening every 1-2 years Colonoscopy UTD Labs with primary  2. Screening for cervical cancer - Cytology - PAP  3. Encounter for surveillance of contraceptive pills Doing well - norethindrone (MICRONOR) 0.35 MG tablet; Take 1 tablet (0.35 mg total) by mouth daily.  Dispense: 84 tablet; Refill: 3

## 2022-06-30 ENCOUNTER — Ambulatory Visit (INDEPENDENT_AMBULATORY_CARE_PROVIDER_SITE_OTHER): Payer: BC Managed Care – PPO | Admitting: Obstetrics and Gynecology

## 2022-06-30 ENCOUNTER — Other Ambulatory Visit (HOSPITAL_COMMUNITY)
Admission: RE | Admit: 2022-06-30 | Discharge: 2022-06-30 | Disposition: A | Discharge status: Home / Self Care | Payer: BC Managed Care – PPO | Source: Ambulatory Visit | Attending: Obstetrics and Gynecology | Admitting: Obstetrics and Gynecology

## 2022-06-30 ENCOUNTER — Encounter: Payer: Self-pay | Admitting: Obstetrics and Gynecology

## 2022-06-30 VITALS — BP 110/62 | HR 78 | Ht 64.25 in | Wt 174.0 lb

## 2022-06-30 DIAGNOSIS — Z01419 Encounter for gynecological examination (general) (routine) without abnormal findings: Secondary | ICD-10-CM

## 2022-06-30 DIAGNOSIS — Z3041 Encounter for surveillance of contraceptive pills: Secondary | ICD-10-CM

## 2022-06-30 DIAGNOSIS — Z124 Encounter for screening for malignant neoplasm of cervix: Secondary | ICD-10-CM

## 2022-06-30 MED ORDER — NORETHINDRONE 0.35 MG PO TABS: 1.0000 | ORAL_TABLET | Freq: Every day | ORAL | 3 refills | Status: DC

## 2022-06-30 NOTE — Patient Instructions (Signed)

## 2022-07-01 LAB — CYTOLOGY - PAP
Comment: NEGATIVE
Diagnosis: NEGATIVE
High risk HPV: NEGATIVE

## 2023-07-04 ENCOUNTER — Ambulatory Visit (INDEPENDENT_AMBULATORY_CARE_PROVIDER_SITE_OTHER): Payer: BC Managed Care – PPO | Admitting: Obstetrics and Gynecology

## 2023-07-04 ENCOUNTER — Encounter: Payer: Self-pay | Admitting: Obstetrics and Gynecology

## 2023-07-04 VITALS — BP 122/74 | HR 94 | Ht 65.5 in | Wt 192.0 lb

## 2023-07-04 DIAGNOSIS — Z1331 Encounter for screening for depression: Secondary | ICD-10-CM

## 2023-07-04 DIAGNOSIS — Z3041 Encounter for surveillance of contraceptive pills: Secondary | ICD-10-CM

## 2023-07-04 DIAGNOSIS — Z01419 Encounter for gynecological examination (general) (routine) without abnormal findings: Secondary | ICD-10-CM | POA: Diagnosis not present

## 2023-07-04 MED ORDER — NORETHINDRONE 0.35 MG PO TABS: 1.0000 | ORAL_TABLET | Freq: Every day | ORAL | 3 refills | Status: DC

## 2023-07-04 NOTE — Patient Instructions (Signed)

## 2023-07-04 NOTE — Progress Notes (Signed)
 53 y.o. [id number] Married Caucasian female here for annual exam.    Some LLQ pain during the weekend, now resolved.  Increased physical activity prior to that.   Taking Micronor  and has light monthly menses.  Happy with her pills.  Missed her pill last night because she ran out.  Some dryness with intercourse.   Detrol LA from Dr. Clarke Crouch is working well overall.  Wears a pad and has some external irritation.  Still has some random leakage.   Mouth is chapped.  She has a Armed forces operational officer.  Second marriage. 2 children. 2 grandchildren. Is a Neurosurgeon at a school.  PCP: Acey Holding, NP   Patient's last menstrual period was 06/14/2023 (approximate).     Period Cycle (Days): 28 Period Duration (Days): 2-3 Period Pattern: Regular Menstrual Flow: Light Menstrual Control: Maxi pad Dysmenorrhea: None     Sexually active: Yes.    The current method of family planning is POP  Menopausal hormone therapy:  n/a Exercising: No.   Smoker:  no  OB History  Gravida Para Term Preterm AB Living  3 2 2  1 2   SAB IAB Ectopic Multiple Live Births  1    2    # Outcome Date GA Lbr Len/2nd Weight Sex Type Anes PTL Lv  3 SAB           2 Term           1 Term              HEALTH MAINTENANCE: Last 2 paps:  06/30/22 neg HR HPV neg, 03/22/19 neg HR HPV neg  History of abnormal Pap or positive HPV:  no Mammogram:  01/13/23 - BI-RADS1, cat B Colonoscopy:  11/02/21 - polyps - due in 2028.  Bone Density:  n/a  Result  n/a   Immunization History  Administered Date(s) Administered   Influenza,inj,Quad PF,6+ Mos 10/29/2013, 11/28/2014, 10/15/2015, 12/03/2016, 01/09/2018, 03/26/2020, 10/16/2020, 11/12/2021   PPD Test 12/18/2013   Tdap 12/12/2013      reports that she has never smoked. She has never used smokeless tobacco. She reports that she does not drink alcohol and does not use drugs.  Past Medical History:  Diagnosis Date   Back pain    Depression    Diverticulitis     Gastritis    GERD (gastroesophageal reflux disease)    Hepatic steatosis    Hypertension    Internal hemorrhoids    Prediabetes 03/21/2019   Sleep apnea 2012   uses cpap   Tubular adenoma of colon    Vitamin D  deficiency     Past Surgical History:  Procedure Laterality Date   BARIATRIC SURGERY  11/05/2020   Weight loss surgery (Sadie)   CERVICAL BIOPSY  W/ LOOP ELECTRODE EXCISION     ~2010   CHOLECYSTECTOMY     COLONOSCOPY     MICROLARYNGOSCOPY WITH CO2 LASER AND EXCISION OF VOCAL CORD LESION     POLYPECTOMY     SHOULDER ARTHROSCOPY Right 2019    Current Outpatient Medications  Medication Sig Dispense Refill   b complex vitamins tablet Take 1 tablet by mouth daily.     desvenlafaxine (PRISTIQ) 25 MG 24 hr tablet Take 25 mg by mouth daily.     diclofenac Sodium (VOLTAREN) 1 % GEL diclofenac 1 % topical gel  APPLY 2 GRAMS TO THE AFFECTED AREA(S) BY TOPICAL ROUTE 4 TIMES PER DAY     esomeprazole  (NEXIUM ) 40 MG capsule TAKE 1 CAPSULE BY  MOUTH EVERY DAY BEFORE BREAKFAST 90 capsule 0   linaclotide (LINZESS) 145 MCG CAPS capsule Take 145 mcg by mouth.     lisinopril  (ZESTRIL ) 10 MG tablet Take 10 mg by mouth daily.     meclizine (ANTIVERT) 25 MG tablet Take 1 tablet by mouth as needed.     norethindrone  (MICRONOR ) 0.35 MG tablet Take 1 tablet (0.35 mg total) by mouth daily. 84 tablet 3   tiZANidine (ZANAFLEX) 4 MG tablet tizanidine 4 mg tablet     tolterodine (DETROL LA) 4 MG 24 hr capsule Take 4 mg by mouth daily.     triamcinolone ointment (KENALOG) 0.1 % as needed.     VITAMIN D , CHOLECALCIFEROL, PO Take 1,000 Int'l Units/day by mouth daily.     buPROPion (WELLBUTRIN XL) 150 MG 24 hr tablet Take 150 mg by mouth every morning.     Current Facility-Administered Medications  Medication Dose Route Frequency Provider Last Rate Last Admin   0.9 %  sodium chloride  infusion  500 mL Intravenous Once Pyrtle, Amber Bail, MD        ALLERGIES: Cephalosporins  Family History  Problem  Relation Age of Onset   Depression Mother    Diabetes Father    Lung cancer Father    Bladder Cancer Father    Heart disease Father    Hypertension Father    Hyperlipidemia Father    Stroke Father    Obesity Father    Colon polyps Paternal Aunt    Colon cancer Paternal Aunt 78   Diabetes Maternal Grandmother    Diabetes Paternal Grandmother    Esophageal cancer Neg Hx    Rectal cancer Neg Hx    Stomach cancer Neg Hx     Review of Systems  All other systems reviewed and are negative.   PHYSICAL EXAM:  BP 122/74 (BP Location: Left Arm, Patient Position: Sitting)   Pulse 94   Ht 5' 5.5 (1.664 m)   Wt 192 lb (87.1 kg)   LMP 06/14/2023 (Approximate)   SpO2 99%   BMI 31.46 kg/m     General appearance: alert, cooperative and appears stated age Head: normocephalic, without obvious abnormality, atraumatic Neck: no adenopathy, supple, symmetrical, trachea midline and thyroid normal to inspection and palpation Lungs: clear to auscultation bilaterally Breasts: normal appearance, no masses or tenderness, No nipple retraction or dimpling, No nipple discharge or bleeding, No axillary adenopathy Heart: regular rate and rhythm Abdomen: soft, non-tender; no masses, no organomegaly Extremities: extremities normal, atraumatic, no cyanosis or edema Skin: skin color, texture, turgor normal. Erythema at corners of the mouth.  Lymph nodes: cervical, supraclavicular, and axillary nodes normal. Neurologic: grossly normal  Pelvic: External genitalia:  vulvar erythema in outline of pad.              No abnormal inguinal nodes palpated.              Urethra:  normal appearing urethra with no masses, tenderness or lesions              Bartholins and Skenes: normal                 Vagina: normal appearing vagina with normal color and discharge, no lesions              Cervix: no lesions              Pap taken: no Bimanual Exam:  Uterus:  normal size, contour, position, consistency, mobility,  non-tender  Adnexa: no mass, fullness, tenderness              Rectal exam: yes.  Confirms.              Anus:  normal sphincter tone, no lesions  Chaperone was present for exam:  Cottie Diss, CMA  ASSESSMENT: Well woman visit with gynecologic exam. Hx LEEP.  Hx weight loss surgery. Vulvitis.  I suspect irritation from pad use.  OAB.  On Detrol.  Still with some urinary incontinence. Vaginal atrophy.  PHQ-9: 0.  Under treatment.   PLAN: Mammogram screening discussed. Self breast awareness reviewed. Pap and HRV collected:  no.  Due in 2027.  Guidelines for Calcium, Vitamin D , regular exercise program including cardiovascular and weight bearing exercise. Medication refills:  Micronor  3 packs, 3 refills.  Lubricants and cooking oils discussed for treating vaginal atrophy.  She will contact her urologist regarding her incontinence.  Switch to a pad without dye or perfume.  She will follow up with dermatology. Follow up:  yearly and prn.

## 2023-12-05 ENCOUNTER — Encounter: Payer: Self-pay | Admitting: Obstetrics and Gynecology

## 2023-12-05 NOTE — Telephone Encounter (Signed)
 Patient scheduled.

## 2023-12-20 ENCOUNTER — Ambulatory Visit: Admitting: Nurse Practitioner

## 2023-12-27 ENCOUNTER — Ambulatory Visit: Admitting: Nurse Practitioner

## 2023-12-28 ENCOUNTER — Encounter: Payer: Self-pay | Admitting: Obstetrics and Gynecology

## 2023-12-28 ENCOUNTER — Ambulatory Visit: Admitting: Obstetrics and Gynecology

## 2023-12-28 VITALS — BP 118/82 | HR 85

## 2023-12-28 DIAGNOSIS — N939 Abnormal uterine and vaginal bleeding, unspecified: Secondary | ICD-10-CM | POA: Diagnosis not present

## 2023-12-28 LAB — PREGNANCY, URINE: Preg Test, Ur: NEGATIVE

## 2023-12-28 MED ORDER — MEDROXYPROGESTERONE ACETATE 10 MG PO TABS: 10.0000 mg | ORAL_TABLET | Freq: Every day | ORAL | 0 refills | Status: AC

## 2023-12-28 NOTE — Progress Notes (Signed)
 GYNECOLOGY  VISIT   HPI: 53 y.o.   Married  Caucasian female   [phone] with Patient's last menstrual period was 12/05/2023 (approximate).   here for: Abnormal bleeding. Started bleeding around 12/05/23, consistent bleeding with some days just spotting some days clots. Currently spotting.    Bleeding for 2 months.  Bleeding 20 - 25 days per month.  Has had spotting and also clotting.  Clots are about size of a quarter.  Two episodes of LLQ pain.  Resolved.   Taking Micronor  daily.  No missed pills.   Started Zepbound in July or August.  No dose changes.  Lost 85 pounds since weight loss surgery.    No partner change.   GYNECOLOGIC HISTORY: Patient's last menstrual period was 12/05/2023 (approximate). Contraception:  Micronor , condoms.   Menopausal hormone therapy:  n/a Last 2 paps:  06/30/22 neg HR HPV neg, 03/22/19 neg HR HPV neg History of abnormal Pap or positive HPV:  no Mammogram:  01/13/23 BIRADS Cat 1 neg (care everywhere)        OB History     Gravida  3   Para  2   Term  2   Preterm      AB  1   Living  2      SAB  1   IAB      Ectopic      Multiple      Live Births  2              Patient Active Problem List   Diagnosis Date Noted   Pain in joint of right knee 05/05/2021   Patellofemoral syndrome of right knee 05/05/2021   Acquired hallux rigidus of left foot 04/27/2021   Bariatric surgery status 01/15/2021   Vitamin D  deficiency 03/31/2020   Class 3 severe obesity with serious comorbidity and body mass index (BMI) of 40.0 to 44.9 in adult (HCC) 11/29/2019   Prediabetes 03/21/2019   Abdominal pain, epigastric 12/12/2018   Nausea and vomiting 12/12/2018   Diarrhea 12/12/2018   History of repair of rotator cuff 11/29/2017   OSA on CPAP 07/30/2014   Mixed hyperlipidemia 07/24/2012   Essential hypertension 04/11/2012    Past Medical History:  Diagnosis Date   Back pain    Depression    Diverticulitis    Gastritis    GERD  (gastroesophageal reflux disease)    Hepatic steatosis    Hypertension    Internal hemorrhoids    Prediabetes 03/21/2019   Sleep apnea 2012   uses cpap   Tubular adenoma of colon    Vitamin D  deficiency     Past Surgical History:  Procedure Laterality Date   BARIATRIC SURGERY  11/05/2020   Weight loss surgery (Sadie)   CERVICAL BIOPSY  W/ LOOP ELECTRODE EXCISION     ~2010   CHOLECYSTECTOMY     COLONOSCOPY     MICROLARYNGOSCOPY WITH CO2 LASER AND EXCISION OF VOCAL CORD LESION     POLYPECTOMY     SHOULDER ARTHROSCOPY Right 2019    Current Outpatient Medications  Medication Sig Dispense Refill   b complex vitamins tablet Take 1 tablet by mouth daily.     desvenlafaxine (PRISTIQ) 25 MG 24 hr tablet Take 25 mg by mouth daily.     diclofenac Sodium (VOLTAREN) 1 % GEL diclofenac 1 % topical gel  APPLY 2 GRAMS TO THE AFFECTED AREA(S) BY TOPICAL ROUTE 4 TIMES PER DAY     esomeprazole  (NEXIUM ) 40 MG capsule  TAKE 1 CAPSULE BY MOUTH EVERY DAY BEFORE BREAKFAST 90 capsule 0   linaclotide (LINZESS) 145 MCG CAPS capsule Take 145 mcg by mouth.     lisinopril  (ZESTRIL ) 10 MG tablet Take 10 mg by mouth daily.     meclizine (ANTIVERT) 25 MG tablet Take 1 tablet by mouth as needed.     norethindrone  (MICRONOR ) 0.35 MG tablet Take 1 tablet (0.35 mg total) by mouth daily. 84 tablet 3   tirzepatide (ZEPBOUND) 2.5 MG/0.5ML injection vial Inject 2.5 mg into the skin.     tiZANidine (ZANAFLEX) 4 MG tablet tizanidine 4 mg tablet     tolterodine (DETROL LA) 4 MG 24 hr capsule Take 4 mg by mouth daily.     triamcinolone ointment (KENALOG) 0.1 % as needed.     VITAMIN D , CHOLECALCIFEROL, PO Take 1,000 Int'l Units/day by mouth daily.     Current Facility-Administered Medications  Medication Dose Route Frequency Provider Last Rate Last Admin   0.9 %  sodium chloride  infusion  500 mL Intravenous Once Pyrtle, Gordy HERO, MD         ALLERGIES: Cephalosporins  Family History  Problem Relation Age of Onset    Depression Mother    Diabetes Father    Lung cancer Father    Bladder Cancer Father    Heart disease Father    Hypertension Father    Hyperlipidemia Father    Stroke Father    Obesity Father    Colon polyps Paternal Aunt    Colon cancer Paternal Aunt 12   Diabetes Maternal Grandmother    Diabetes Paternal Grandmother    Esophageal cancer Neg Hx    Rectal cancer Neg Hx    Stomach cancer Neg Hx     Social History   Socioeconomic History   Marital status: Married    Spouse name: Malaijah Houchen   Number of children: 2   Years of education: Not on file   Highest education level: Not on file  Occupational History   Occupation: Neurosurgeon   Tobacco Use   Smoking status: Never   Smokeless tobacco: Never  Vaping Use   Vaping status: Never Used  Substance and Sexual Activity   Alcohol use: Never   Drug use: Never   Sexual activity: Yes    Partners: Male    Birth control/protection: Pill  Other Topics Concern   Not on file  Social History Narrative   Not on file   Social Drivers of Health   Financial Resource Strain: Low Risk (10/05/2023)   Received from [facility]   Overall Financial Resource Strain (CARDIA)    How hard is it for you to pay for the very basics like food, housing, medical care, and heating?: Not hard at all  Food Insecurity: No Food Insecurity (10/05/2023)   Received from [facility]   Hunger Vital Sign    Within the past 12 months, you worried that your food would run out before you got the money to buy more.: Never true    Within the past 12 months, the food you bought just didn't last and you didn't have money to get more.: Never true  Transportation Needs: No Transportation Needs (10/05/2023)   Received from [facility]   PRAPARE - Transportation    Lack of Transportation (Medical): No    Lack of Transportation (Non-Medical): No  Physical Activity: Not on file  Stress: Not on file  Social Connections: Not on file  Intimate Partner  Violence: Not At Risk (05/19/2023)   Received from [facility]   Humiliation, Afraid, Rape, and Kick questionnaire    Within the last year, have you been afraid of your partner or ex-partner?: No    Within the last year, have you been humiliated or emotionally abused in other ways by your partner or ex-partner?: No    Within the last year, have you been kicked, hit, slapped, or otherwise physically hurt by your partner or ex-partner?: No    Within the last year, have you been raped or forced to have any kind of sexual activity by your partner or ex-partner?: No    Review of Systems  Genitourinary:  Positive for vaginal bleeding.  All other systems reviewed and are negative.   PHYSICAL EXAMINATION:   BP 118/82 (BP Location: Left Arm, Patient Position: Sitting)   Pulse 85   LMP 12/05/2023 (Approximate)   SpO2 99%     General appearance: alert, cooperative and appears stated age   Pelvic: External genitalia:  no lesions              Urethra:  normal appearing urethra with no masses, tenderness or lesions              Bartholins and Skenes: normal                 Vagina: normal appearing vagina with normal color and discharge, no lesions              Cervix: no lesions.  Small amount of dark clot in the vagina.                  Bimanual Exam:  Uterus:  normal size, contour, position, consistency, mobility, mild tenderness with touching the fundus.              Adnexa: no mass, fullness, tenderness              Rectal exam: yes.  Confirms.              Anus:  normal sphincter tone, no lesions  Chaperone was present for exam:  Kari HERO, CMA  ASSESSMENT:  Abnormal uterine bleeding.  Micronor .  On Zepbound.  HTN.   PLAN:  UPT neg. Potential etiologies of abnormal bleeding reviewed:  interactions of medications, polyp, ovarian cysts, fibroids, changes in the endometrium.  Stop Micronor .  Take Provera 10 mg daily for 14 days.  Side effects reviewed.  She understands this does not  prevent pregnancy.  Start in about 2 days.  Expect a withdrawal bleeding after discontinuing this medication.    Return for pelvic US  and possible endometrial biopsy.  Procedures explained.   Questions invited and answered.  30 min  total time was spent for this patient encounter, including preparation, face-to-face counseling with the patient, coordination of care, and documentation of the encounter.

## 2023-12-28 NOTE — Patient Instructions (Addendum)
 Abnormal Uterine Bleeding Abnormal uterine bleeding means bleeding more than normal from your womb (uterus). It can include: Bleeding after sex. Bleeding between monthly (menstrual) periods. Bleeding that is heavier than normal. Monthly periods that last longer than normal. Bleeding after you have stopped having your monthly period (menopause). You should see a doctor for any kind of bleeding that is not normal. Treatment depends on the cause of your bleeding and how much you bleed. Follow these instructions at home: Medicines Take over-the-counter and prescription medicines only as told by your doctor. Ask your doctor about: Taking medicines such as aspirin and ibuprofen. Do not take these medicines unless your doctor tells you to take them. Taking over-the-counter medicines, vitamins, herbs, and supplements. You may be given iron pills. Take them as told by your doctor. Managing constipation If you take iron pills, you may need to take these actions to prevent or treat trouble pooping (constipation): Drink enough fluid to keep your pee (urine) pale yellow. Take over-the-counter or prescription medicines. Eat foods that are high in fiber. These include beans, whole grains, and fresh fruits and vegetables. Limit foods that are high in fat and sugar. These include fried or sweet foods. Activity Change your activity to decrease bleeding if you need to change your sanitary pad more than one time every 2 hours: Lie in bed with your feet raised (elevated). Place a cold pack on your lower belly. Rest as much as you are able until the bleeding stops or slows down. General instructions Do not use tampons, douche, or have sex until your doctor says these things are okay. Change your pads often. Get regular exams. These include: Pelvic exams. Screenings for cancer of the cervix. It is up to you to get the results of any tests that are done. Ask how to get your results when they are  ready. Watch for any changes in your bleeding. For 2 months, write down: When your monthly period starts. When your monthly period ends. When you get any abnormal bleeding from your vagina. What problems you notice. Keep all follow-up visits. Contact a doctor if: The bleeding lasts more than one week. You feel dizzy at times. You feel like you may vomit (nausea). You vomit. You feel light-headed or weak. Your symptoms get worse. Get help right away if: You faint. You have to change pads every hour. You have pain in your belly. You have a fever or chills. You get sweaty or weak. You pass large blood clots from your vagina. These symptoms may be an emergency. Get help right away. Call your local emergency services (911 in the [place] Do not wait to see if the symptoms will go away. Do not drive yourself to the hospital. Summary Abnormal uterine bleeding means bleeding more than normal from your womb (uterus). Any kind of bleeding that is not normal should be checked by a doctor. Treatment depends on the cause of your bleeding and how much you bleed. Get help right away if you faint, you have to change pads every hour, or you pass large blood clots from your vagina. This information is not intended to replace advice given to you by your health care provider. Make sure you discuss any questions you have with your health care provider. Document Revised: 05/06/2020 Document Reviewed: 05/06/2020 Elsevier Patient Education  2024 [employer]  Endometrial Biopsy  An endometrial biopsy is a procedure where a tissue sample is removed from the lining of the uterus. This lining is called the endometrium.  The tissue sample is then sent to a lab for testing. You may have this type of biopsy to check for: Cancer. Infection. Growths called polyps. Uterine bleeding that can't be explained. Tell a health care provider about: Any allergies you have. All medicines you're taking including  vitamins, herbs, eye drops, creams, and over-the-counter medicines. Any problems you or family members have had with anesthesia. Any bleeding problems you have. Any surgeries you have had. Any medical problems you have. Whether you're pregnant or may be pregnant. What are the risks? Your health care provider will talk with you about risks. These may include: Bleeding. Infection. Allergic reactions to medicines. Damage to the wall of the uterus. This is rare. What happens before the procedure? Keep track of your period. You may need to have this biopsy when you're not having your period. Ask your provider about: Changing or stopping your regular medicines. These include any diabetes medicines or blood thinners you take. Taking medicines such as aspirin and ibuprofen. These medicines can thin your blood. Do not take them unless your provider tells you to. Taking over-the-counter medicines, vitamins, herbs, and supplements. Bring a pad with you. You may need to wear one after the biopsy. Plan to have a responsible adult take you home from the hospital or clinic. You won't be allowed to drive. What happens during the procedure? A tool will be put into your vagina to hold it open. This helps your provider see the cervix. The cervix is the lowest part of the uterus. Your cervix will be cleaned with a solution that kills germs. You will be given anesthesia. This keeps you from feeling pain. It will numb your cervix. A tool called forceps will be used to hold your cervix steady. A thin tool called a uterine sound will be put through your cervix. It will be used to: Find the length of your uterus. Find where to take the sample from. A soft tube called a catheter will be put into your uterus. The catheter will remove a tissue sample. The tube and tools will be removed. The sample will be sent to a lab for testing. The procedure may vary among providers and hospitals. What happens after the  procedure? Your blood pressure, heart rate, breathing rate, and blood oxygen level will be monitored until you leave the hospital or clinic. It's up to you to get the results of your procedure. Ask your provider, or the department that is doing the procedure, when your results will be ready. This information is not intended to replace advice given to you by your health care provider. Make sure you discuss any questions you have with your health care provider. Document Revised: 03/16/2022 Document Reviewed: 03/16/2022 Elsevier Patient Education  2024 [employer].

## 2023-12-28 NOTE — Addendum Note (Signed)
 Addended by: REUBEN KARI BRAVO on: 12/28/2023 03:48 PM   Modules accepted: Orders

## 2024-01-05 ENCOUNTER — Encounter: Payer: Self-pay | Admitting: Obstetrics and Gynecology

## 2024-01-10 ENCOUNTER — Ambulatory Visit

## 2024-01-10 DIAGNOSIS — N939 Abnormal uterine and vaginal bleeding, unspecified: Secondary | ICD-10-CM

## 2024-01-13 ENCOUNTER — Ambulatory Visit: Payer: Self-pay | Admitting: Obstetrics and Gynecology

## 2024-02-21 ENCOUNTER — Ambulatory Visit: Admitting: Obstetrics and Gynecology

## 2024-03-20 ENCOUNTER — Ambulatory Visit: Admitting: Obstetrics and Gynecology

## 2024-07-10 ENCOUNTER — Ambulatory Visit: Admitting: Obstetrics and Gynecology
